# Patient Record
Sex: Male | Born: 1965
Health system: Southern US, Community
[De-identification: ages and names within clinical notes are randomized; demographics above are authoritative.]

## PROBLEM LIST (undated history)

## (undated) DIAGNOSIS — K219 Gastro-esophageal reflux disease without esophagitis: Secondary | ICD-10-CM

## (undated) DIAGNOSIS — E785 Hyperlipidemia, unspecified: Secondary | ICD-10-CM

## (undated) DIAGNOSIS — T7840XA Allergy, unspecified, initial encounter: Secondary | ICD-10-CM

## (undated) HISTORY — DX: Gastro-esophageal reflux disease without esophagitis: K21.9

## (undated) HISTORY — DX: Allergy, unspecified, initial encounter: T78.40XA

## (undated) HISTORY — PX: VASECTOMY: SHX75

## (undated) HISTORY — DX: Hyperlipidemia, unspecified: E78.5

## (undated) HISTORY — PX: NO PAST SURGERIES: SHX2092

---

## 2007-08-22 ENCOUNTER — Ambulatory Visit: Payer: Self-pay | Admitting: Family Medicine

## 2007-08-30 ENCOUNTER — Ambulatory Visit: Payer: Self-pay | Admitting: Family Medicine

## 2007-10-14 ENCOUNTER — Emergency Department: Payer: Self-pay | Admitting: Emergency Medicine

## 2008-12-03 IMAGING — CT CT CHEST-ABD W/ CM
3 of 6 series · 13 of 32 positions shown, 18 images · non-contrast
Comparison: none

REASON FOR EXAM: RUQ  abd pain  right lower chest pain   Per Dr. Brabir
wants w/wo
COMMENTS:

[Series 2: soft tissue · axial · 0.73mm/px · z∈[-466,-121]mm · 6 of 97 slices shown, 11 images (1 of 3)]
[im 14/97  soft-tissue]
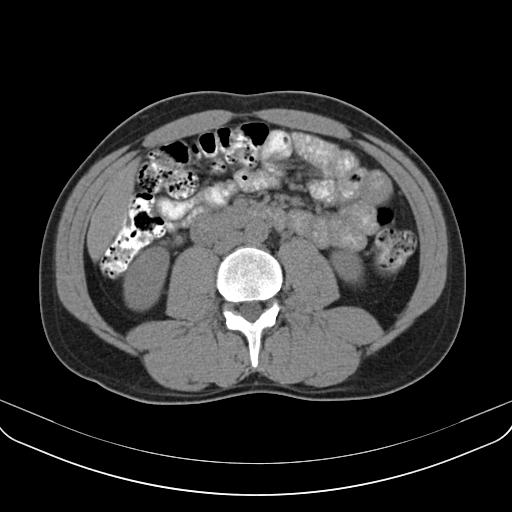
[im 14/97  bone]
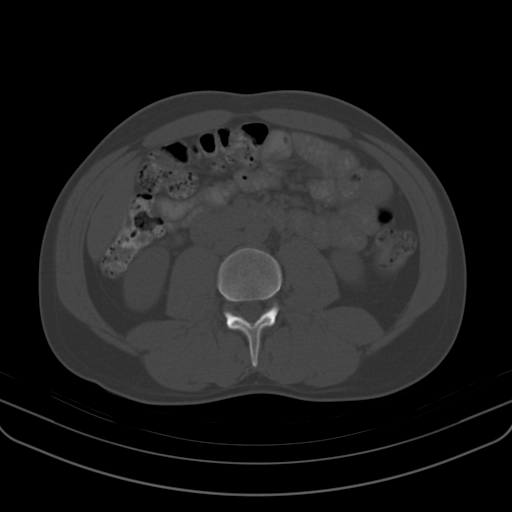
[im 28/97  soft-tissue]
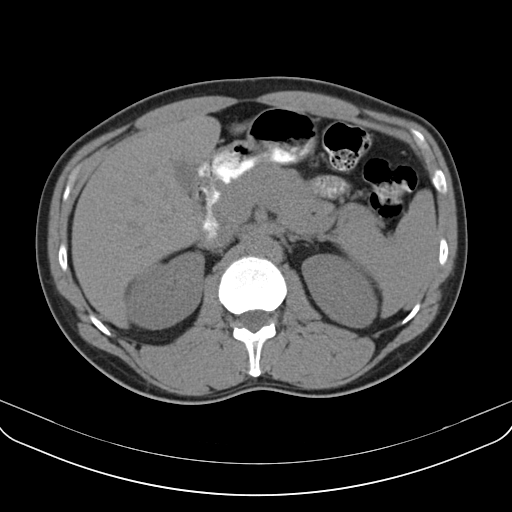
[im 42/97  soft-tissue]
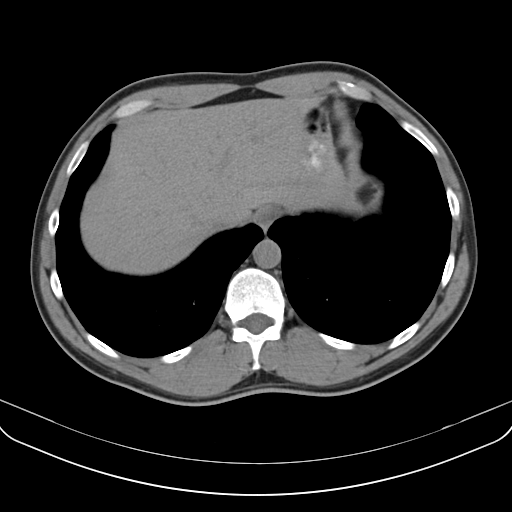
[im 42/97  lung]
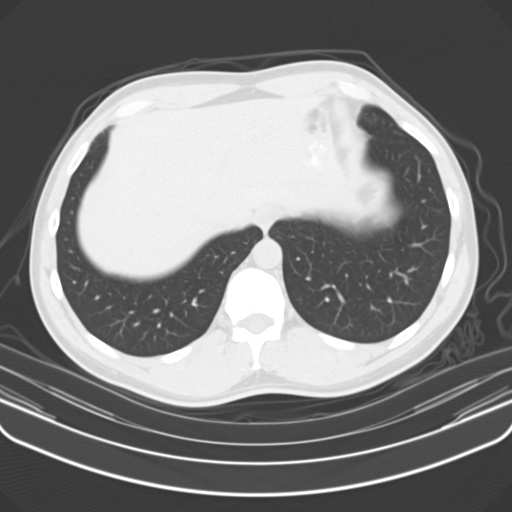
[im 55/97  soft-tissue]
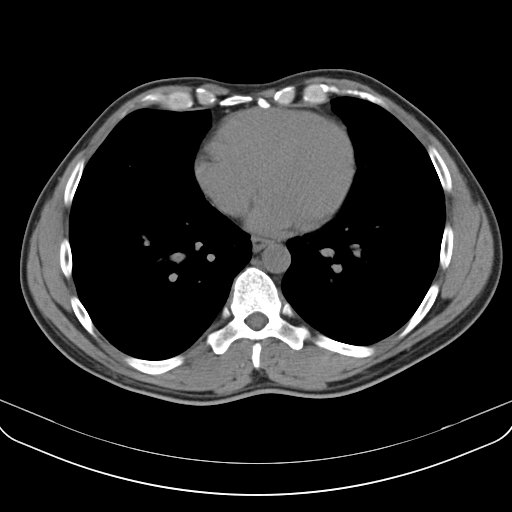
[im 55/97  lung]
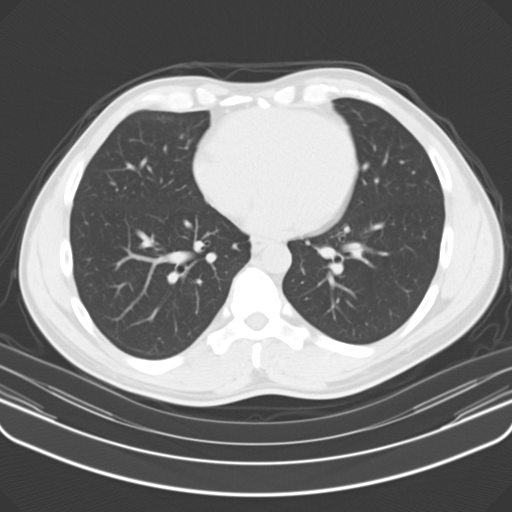
[im 69/97  soft-tissue]
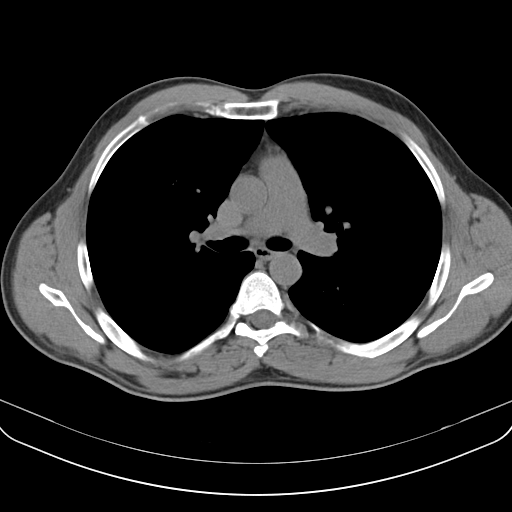
[im 69/97  lung]
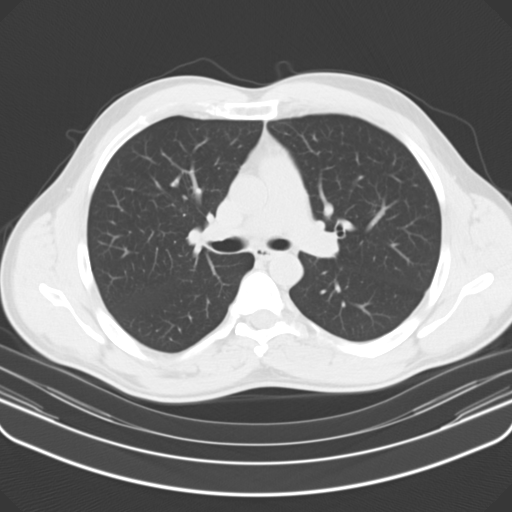
[im 83/97  soft-tissue]
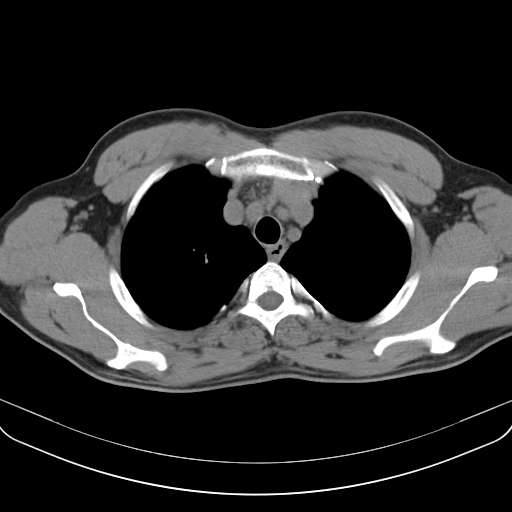
[im 83/97  lung]
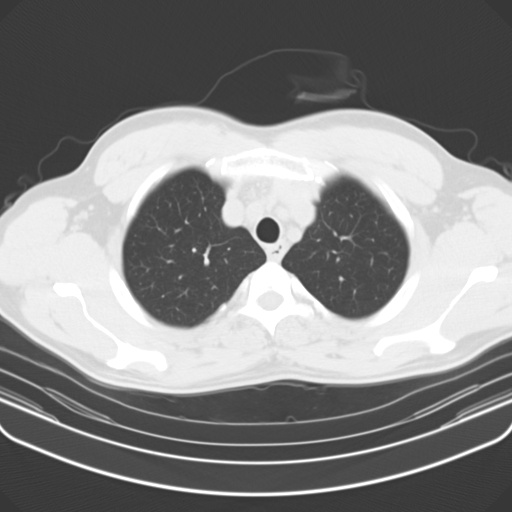

[Series 4: soft tissue · axial · 0.73mm/px · z∈[-451,-131]mm · 5 of 97 slices shown (2 of 3)]
[im 17/97  soft-tissue]
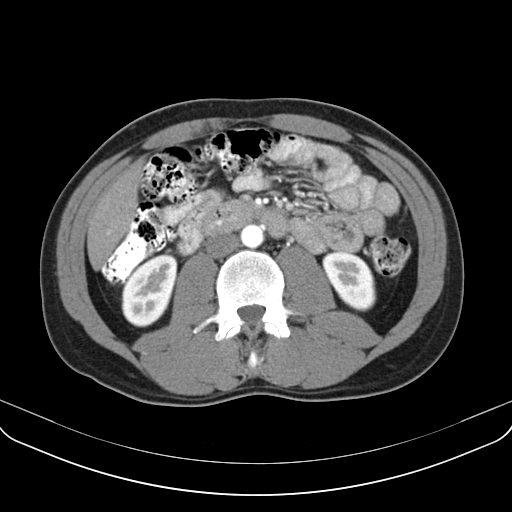
[im 33/97  soft-tissue]
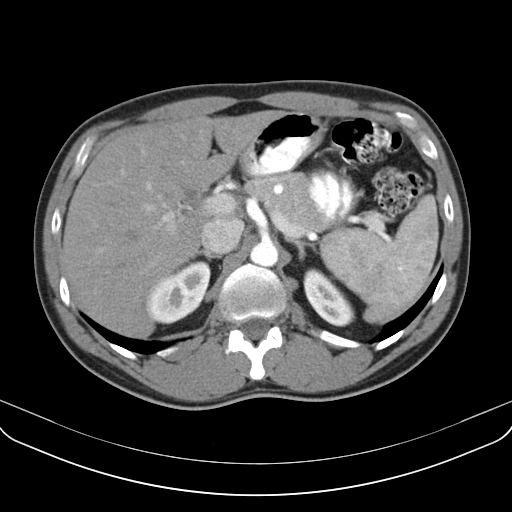
[im 49/97  soft-tissue]
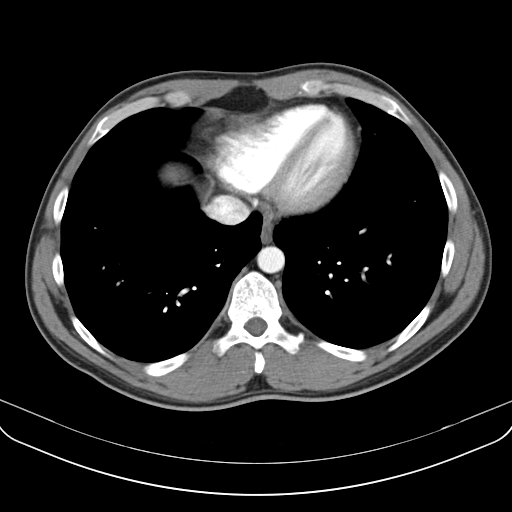
[im 65/97  soft-tissue]
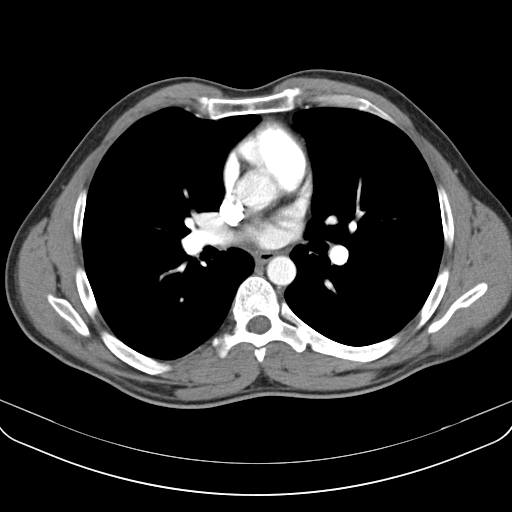
[im 81/97  soft-tissue]
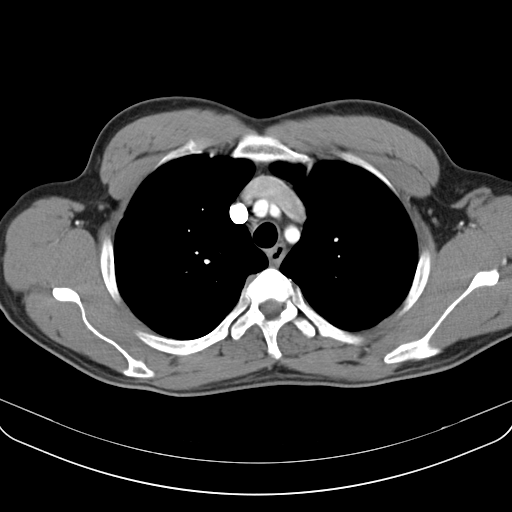

[Series 8: soft tissue · axial · 0.73mm/px · z∈[-446,-362]mm · 2 of 52 slices shown (3 of 3)]
[im 18/52  soft-tissue]
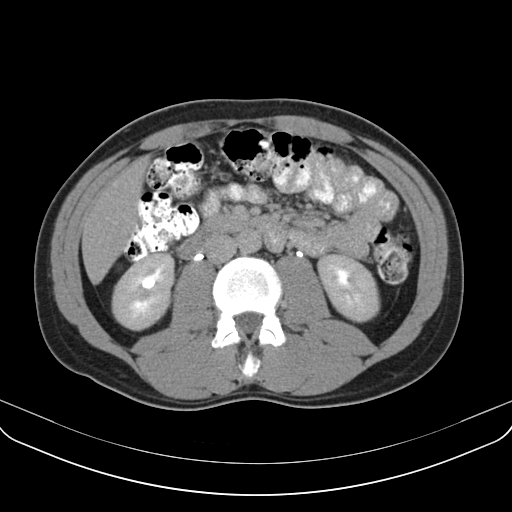
[im 35/52  soft-tissue]
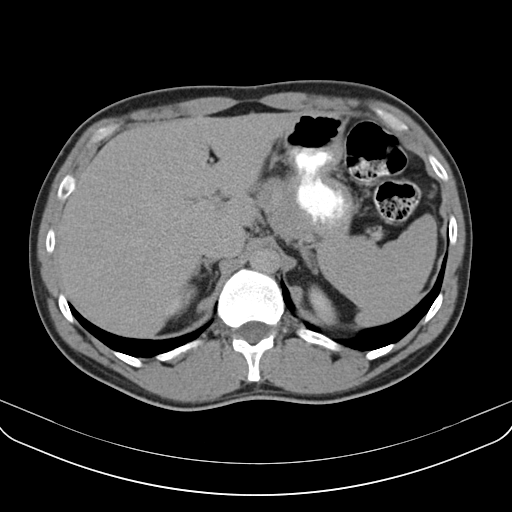

[13 of 32 positions shown; findings below may reference images not displayed]

PROCEDURE:     [facility] - K[facility]IC CHEST AND ABDOMEN W  - August 30, 2007 [DATE]

RESULT:     Triphasic CT scan of the abdomen without and with contrast and
CT of the chest is performed. The patient received 150 ml of 9sovue-XGG
iodinated intravenous contrast for the exam. There is no previous exam for
comparison.

Noncontrast images of the chest and abdomen demonstrate no evidence of
abnormal calcification within the mediastinal or hilar regions. The
abdominal aorta and thoracic aorta show no evidence of significant
atherosclerotic calcification. Very minimal atherosclerotic calcification is
present. There are no radiopaque gallstones or renal calculi.

Following administration of iodinated contrast, the imaging through the
chest shows normal enhancement of the pulmonary arteries and aorta. The
lungs are clear. There is no infiltrate, edema, pleural effusion or
underlying pulmonary parenchymal mass. There is no atelectasis or
significant fibrosis. The heart is normal in size. There is no hiatal
hernia. The spleen, pancreas, liver, gallbladder, kidneys and adrenal glands
as well as the abdominal aorta appear to be normal. The included bowel is
within normal limits.

On the delayed postcontrast images both kidneys excrete contrast opacified
urine into nonobstructed systems. There is no adenopathy evident.
IMPRESSION: 1. No focal cardiopulmonary abnormality evident.
2. No abdominal abnormality evident.

## 2011-04-27 ENCOUNTER — Ambulatory Visit: Payer: Self-pay | Admitting: Neurology

## 2011-11-05 ENCOUNTER — Emergency Department: Payer: Self-pay | Admitting: Emergency Medicine

## 2011-11-05 LAB — COMPREHENSIVE METABOLIC PANEL
Alkaline Phosphatase: 64 U/L (ref 50–136)
Anion Gap: 10 (ref 7–16)
Calcium, Total: 8.8 mg/dL (ref 8.5–10.1)
Chloride: 107 mmol/L (ref 98–107)
Co2: 23 mmol/L (ref 21–32)
Creatinine: 0.88 mg/dL (ref 0.60–1.30)
Glucose: 124 mg/dL — ABNORMAL HIGH (ref 65–99)
Osmolality: 281 (ref 275–301)
Sodium: 140 mmol/L (ref 136–145)
Total Protein: 7.1 g/dL (ref 6.4–8.2)

## 2011-11-05 LAB — CBC
HCT: 45.6 % (ref 40.0–52.0)
HGB: 15.4 g/dL (ref 13.0–18.0)
MCH: 31.8 pg (ref 26.0–34.0)
MCHC: 33.9 g/dL (ref 32.0–36.0)
MCV: 94 fL (ref 80–100)
Platelet: 176 10*3/uL (ref 150–440)
RDW: 12.7 % (ref 11.5–14.5)

## 2012-09-29 ENCOUNTER — Ambulatory Visit: Payer: Self-pay | Admitting: Family Medicine

## 2012-12-29 ENCOUNTER — Ambulatory Visit: Payer: Self-pay

## 2014-01-17 LAB — CBC AND DIFFERENTIAL
HCT: 46 % (ref 41–53)
HEMOGLOBIN: 16.1 g/dL (ref 13.5–17.5)
NEUTROS ABS: 50 /uL
PLATELETS: 183 10*3/uL (ref 150–399)
WBC: 4.6 10*3/mL

## 2014-01-17 LAB — LIPID PANEL
CHOLESTEROL: 213 mg/dL — AB (ref 0–200)
HDL: 51 mg/dL (ref 35–70)
LDL CALC: 145 mg/dL
LDl/HDL Ratio: 2.8
TRIGLYCERIDES: 86 mg/dL (ref 40–160)

## 2014-01-17 LAB — HEPATIC FUNCTION PANEL
ALK PHOS: 62 U/L (ref 25–125)
ALT: 21 U/L (ref 10–40)
AST: 21 U/L (ref 14–40)
BILIRUBIN, TOTAL: 0.9 mg/dL

## 2014-01-17 LAB — TSH: TSH: 3.28 u[IU]/mL (ref <=5.90)

## 2014-01-17 LAB — PSA: PSA: 0.8

## 2014-05-09 LAB — HM COLONOSCOPY

## 2015-01-11 DIAGNOSIS — K219 Gastro-esophageal reflux disease without esophagitis: Secondary | ICD-10-CM | POA: Insufficient documentation

## 2015-01-11 DIAGNOSIS — G8929 Other chronic pain: Secondary | ICD-10-CM | POA: Insufficient documentation

## 2015-01-11 DIAGNOSIS — G609 Hereditary and idiopathic neuropathy, unspecified: Secondary | ICD-10-CM | POA: Insufficient documentation

## 2015-01-11 DIAGNOSIS — M545 Low back pain, unspecified: Secondary | ICD-10-CM | POA: Insufficient documentation

## 2015-01-11 DIAGNOSIS — L309 Dermatitis, unspecified: Secondary | ICD-10-CM | POA: Insufficient documentation

## 2015-01-11 DIAGNOSIS — G2581 Restless legs syndrome: Secondary | ICD-10-CM | POA: Insufficient documentation

## 2015-01-11 DIAGNOSIS — B029 Zoster without complications: Secondary | ICD-10-CM | POA: Insufficient documentation

## 2015-01-11 DIAGNOSIS — J309 Allergic rhinitis, unspecified: Secondary | ICD-10-CM | POA: Insufficient documentation

## 2015-01-11 DIAGNOSIS — E785 Hyperlipidemia, unspecified: Secondary | ICD-10-CM | POA: Insufficient documentation

## 2015-01-11 DIAGNOSIS — E559 Vitamin D deficiency, unspecified: Secondary | ICD-10-CM | POA: Insufficient documentation

## 2015-02-12 ENCOUNTER — Other Ambulatory Visit: Payer: Self-pay | Admitting: Family Medicine

## 2015-02-12 MED ORDER — SIMVASTATIN 40 MG PO TABS: 40.0000 mg | ORAL_TABLET | Freq: Every day | ORAL | Status: DC

## 2015-02-12 NOTE — Telephone Encounter (Signed)
Done-aa 

## 2015-02-12 NOTE — Telephone Encounter (Signed)
Pt's wife contacted office for refill request on the following medications: simvastatin (ZOCOR) 40 MG tablet to [facility]. Thanks TNP

## 2015-03-18 ENCOUNTER — Encounter: Payer: Self-pay | Admitting: Family Medicine

## 2015-03-18 ENCOUNTER — Ambulatory Visit (INDEPENDENT_AMBULATORY_CARE_PROVIDER_SITE_OTHER): Payer: BLUE CROSS/BLUE SHIELD | Admitting: Family Medicine

## 2015-03-18 VITALS — BP 128/72 | HR 72 | Temp 98.0°F | Resp 14 | Ht 70.0 in | Wt 184.0 lb

## 2015-03-18 DIAGNOSIS — Z125 Encounter for screening for malignant neoplasm of prostate: Secondary | ICD-10-CM | POA: Diagnosis not present

## 2015-03-18 DIAGNOSIS — M791 Myalgia, unspecified site: Secondary | ICD-10-CM

## 2015-03-18 DIAGNOSIS — R253 Fasciculation: Secondary | ICD-10-CM

## 2015-03-18 DIAGNOSIS — Z0000 Encounter for general adult medical examination without abnormal findings: Secondary | ICD-10-CM

## 2015-03-18 DIAGNOSIS — R258 Other abnormal involuntary movements: Secondary | ICD-10-CM

## 2015-03-18 NOTE — Progress Notes (Signed)
Patient ID: Alan Schultz, male   DOB: 21-Apr-1966, 49 y.o.   MRN: [id number]  Visit Date: 03/18/2015  Today's Provider: [name], MD   Chief Complaint  Patient presents with  . Annual Exam   Subjective:  Alan Schultz is a 49 y.o. male who presents today for health maintenance and complete physical. He feels well. He reports exercising does a lot of yard work. He reports he is sleeping well.  LAST: Colonoscopy-05/09/14 hemorrhoids, otherwise normal  Endoscopy 05/09/14 normal  EKG-01/15/14.   Review of Systems  Constitutional: Negative.   HENT: Negative.   Eyes: Negative.   Respiratory: Negative.   Cardiovascular: Negative.   Gastrointestinal: Negative.   Endocrine: Negative.   Genitourinary: Negative.   Musculoskeletal: Negative.   Skin: Negative.   Allergic/Immunologic: Negative.   Neurological: Negative.        Occasional mild muscle twitching of arms and legs, mainly arms.  Hematological: Negative.   Psychiatric/Behavioral: Negative.     Social History   Social History  . Marital Status: Married    Spouse Name: N/A  . Number of Children: N/A  . Years of Education: N/A   Occupational History  . Not on file.   Social History Main Topics  . Smoking status: Never Smoker   . Smokeless tobacco: Never Used  . Alcohol Use: No  . Drug Use: No  . Sexual Activity: Not on file   Other Topics Concern  . Not on file   Social History Narrative    Patient Active Problem List   Diagnosis Date Noted  . Allergic rhinitis 01/11/2015  . Chronic LBP 01/11/2015  . Dermatitis, eczematoid 01/11/2015  . Acid reflux 01/11/2015  . HLD (hyperlipidemia) 01/11/2015  . Idiopathic peripheral neuropathy 01/11/2015  . Restless leg 01/11/2015  . Herpes zoster 01/11/2015  . Avitaminosis D 01/11/2015    Past Surgical History  Procedure Laterality Date  . Vasectomy      His family history includes Osteoporosis in his mother.    Outpatient Prescriptions Prior to Visit   Medication Sig Dispense Refill  . aspirin 81 MG tablet Take by mouth daily.    . montelukast (SINGULAIR) 10 MG tablet Take by mouth daily.    . Omeprazole 20 MG TBEC Take by mouth daily.    . simvastatin (ZOCOR) 40 MG tablet Take 1 tablet (40 mg total) by mouth at bedtime. 30 tablet 0  . loratadine-pseudoephedrine (CLARITIN-D 24-HOUR) 10-240 MG per 24 hr tablet Take by mouth as needed.     No facility-administered medications prior to visit.    Patient Care Team: Maple Hudson., MD as PCP - General (Family Medicine)     Objective:   Vitals:  Filed Vitals:   03/18/15 1030  BP: 128/72  Pulse: 72  Temp: 98 F (36.7 C)  Resp: 14  Height: 5\' 10"  (1.778 m)  Weight: 184 lb (83.462 kg)    Physical Exam  Constitutional: He is oriented to person, place, and time. He appears well-developed and well-nourished.  HENT:  Head: Normocephalic and atraumatic.  Right Ear: External ear normal.  Nose: Nose normal.  Mouth/Throat: Oropharynx is clear and moist.  Eyes: Conjunctivae are normal. Pupils are equal, round, and reactive to light.  Neck: Neck supple.  Cardiovascular: Normal rate, regular rhythm, normal heart sounds and intact distal pulses.   Pulmonary/Chest: Effort normal and breath sounds normal.  Abdominal: Bowel sounds are normal.  Genitourinary: Rectum normal, prostate normal and penis normal.  Musculoskeletal: Normal  range of motion.  Neurological: He is alert and oriented to person, place, and time.  Skin: Skin is warm.  Very mild eczema of elbows and knees.  Psychiatric: He has a normal mood and affect. His behavior is normal. Judgment normal.     Depression Screen PHQ 2/9 Scores 03/18/2015  PHQ - 2 Score 0      Assessment & Plan:    Complete physical exam Normal exam.  Immunization History  Administered Date(s) Administered  . Td 05/20/1999  . Tdap 01/05/2006   Eczema Treat with mometasone cream. Muscle fasciculations Exam normal today. Refer to  neurology for consideration of workup.  I have done the exam and reviewed the above chart and it is accurate to the best of my knowledge.

## 2015-03-19 LAB — COMPREHENSIVE METABOLIC PANEL
ALT: 16 IU/L (ref 0–44)
AST: 17 IU/L (ref 0–40)
Albumin/Globulin Ratio: 2.1 (ref 1.1–2.5)
Albumin: 4.7 g/dL (ref 3.5–5.5)
Alkaline Phosphatase: 65 IU/L (ref 39–117)
BILIRUBIN TOTAL: 1.3 mg/dL — AB (ref 0.0–1.2)
BUN/Creatinine Ratio: 12 (ref 9–20)
BUN: 12 mg/dL (ref 6–24)
CHLORIDE: 101 mmol/L (ref 97–108)
CO2: 25 mmol/L (ref 18–29)
CREATININE: 0.97 mg/dL (ref 0.76–1.27)
Calcium: 9.6 mg/dL (ref 8.7–10.2)
GFR calc non Af Amer: 90 mL/min/{1.73_m2} (ref >59)
GFR, EST AFRICAN AMERICAN: 104 mL/min/{1.73_m2} (ref >59)
GLUCOSE: 97 mg/dL (ref 65–99)
Globulin, Total: 2.2 g/dL (ref 1.5–4.5)
Potassium: 4.6 mmol/L (ref 3.5–5.2)
Sodium: 141 mmol/L (ref 134–144)
TOTAL PROTEIN: 6.9 g/dL (ref 6.0–8.5)

## 2015-03-19 LAB — CBC WITH DIFFERENTIAL/PLATELET
Basophils Absolute: 0 10*3/uL (ref 0.0–0.2)
Basos: 1 %
EOS (ABSOLUTE): 0.2 10*3/uL (ref 0.0–0.4)
Eos: 6 %
HEMOGLOBIN: 16.3 g/dL (ref 12.6–17.7)
Hematocrit: 45.8 % (ref 37.5–51.0)
IMMATURE GRANS (ABS): 0 10*3/uL (ref 0.0–0.1)
Immature Granulocytes: 0 %
LYMPHS: 38 %
Lymphocytes Absolute: 1.5 10*3/uL (ref 0.7–3.1)
MCH: 32.5 pg (ref 26.6–33.0)
MCHC: 35.6 g/dL (ref 31.5–35.7)
MCV: 91 fL (ref 79–97)
Monocytes Absolute: 0.3 10*3/uL (ref 0.1–0.9)
Monocytes: 8 %
NEUTROS ABS: 2 10*3/uL (ref 1.4–7.0)
Neutrophils: 47 %
PLATELETS: 187 10*3/uL (ref 150–379)
RBC: 5.01 x10E6/uL (ref 4.14–5.80)
RDW: 13.4 % (ref 12.3–15.4)
WBC: 4.1 10*3/uL (ref 3.4–10.8)

## 2015-03-19 LAB — CK: CK TOTAL: 107 U/L (ref 24–204)

## 2015-03-19 LAB — POCT URINALYSIS DIPSTICK
BILIRUBIN UA: NEGATIVE
Glucose, UA: NEGATIVE
Ketones, UA: NEGATIVE
Leukocytes, UA: NEGATIVE
NITRITE UA: NEGATIVE
Protein, UA: NEGATIVE
RBC UA: NEGATIVE
SPEC GRAV UA: 1.025
Urobilinogen, UA: 0.2
pH, UA: 6.5

## 2015-03-19 LAB — LIPID PANEL WITH LDL/HDL RATIO
Cholesterol, Total: 216 mg/dL — ABNORMAL HIGH (ref 100–199)
HDL: 48 mg/dL (ref >39)
LDL Calculated: 139 mg/dL — ABNORMAL HIGH (ref 0–99)
LDL/HDL RATIO: 2.9 ratio (ref 0.0–3.6)
Triglycerides: 145 mg/dL (ref 0–149)
VLDL Cholesterol Cal: 29 mg/dL (ref 5–40)

## 2015-03-19 LAB — PSA: Prostate Specific Ag, Serum: 0.9 ng/mL (ref 0.0–4.0)

## 2015-03-19 LAB — TSH: TSH: 2.13 u[IU]/mL (ref 0.450–4.500)

## 2015-03-31 ENCOUNTER — Other Ambulatory Visit: Payer: Self-pay | Admitting: Family Medicine

## 2015-03-31 MED ORDER — PROMETHAZINE HCL 25 MG PO TABS: 25.0000 mg | ORAL_TABLET | Freq: Four times a day (QID) | ORAL | Status: DC | PRN

## 2015-03-31 NOTE — Telephone Encounter (Signed)
Promethazine 25 mg every 6 hours when necessary nausea #25 one refill

## 2015-03-31 NOTE — Telephone Encounter (Signed)
Ok to Rx Promethazine? Please advise. Thanks!

## 2015-03-31 NOTE — Telephone Encounter (Signed)
Pt wife Toniann Fail called to request a Rx for nausea for pt.  Pt started having nausea  last night.  No diarrhea and no throwing up.  Pt wife states she has had Trometrarazine before.  [employer] [facility].  CB#[phone]/MW

## 2015-03-31 NOTE — Telephone Encounter (Signed)
Rx sent to pharmacy   

## 2015-04-04 ENCOUNTER — Ambulatory Visit (INDEPENDENT_AMBULATORY_CARE_PROVIDER_SITE_OTHER): Payer: BLUE CROSS/BLUE SHIELD | Admitting: Family Medicine

## 2015-04-04 ENCOUNTER — Encounter: Payer: Self-pay | Admitting: Family Medicine

## 2015-04-04 VITALS — BP 118/68 | HR 70 | Temp 97.7°F | Resp 16 | Wt 187.8 lb

## 2015-04-04 DIAGNOSIS — A09 Infectious gastroenteritis and colitis, unspecified: Secondary | ICD-10-CM

## 2015-04-04 DIAGNOSIS — K529 Noninfective gastroenteritis and colitis, unspecified: Secondary | ICD-10-CM

## 2015-04-04 NOTE — Patient Instructions (Signed)
Discussed use of imodium and keeping up with fluids.

## 2015-04-04 NOTE — Progress Notes (Signed)
Subjective:     Patient ID: Alan Schultz, male   DOB: 22-Aug-1965, 49 y.o.   MRN: [id number]  HPI  Chief Complaint  Patient presents with  . Diarrhea    Patient comes in office today with complaints of nausea, vomiting and diarrhea since Sunday. Patient reports that Saturday evening he had attended a wedding and at reception table was served chicken, it wasnt till latley when it was discovered that chicked was not cooked all the way. Patient states that nausea and vomiting have stopped but diarrhea has remained persistant.  Reports chills at onset but no fever documented. Diarrhea remains loose and watery once daily. Has resumed eating and is drinking Gatorade to keep up with his fluids. He is checking to see if others are ill. He has missed work 9/12-9/14.   Review of Systems  Gastrointestinal:       Usual bowel pattern is every other day.       Objective:   Physical Exam  Constitutional: He appears well-developed and well-nourished. No distress.  Abdominal: Soft. There is no tenderness. There is no guarding.  Hyperactive bowel sounds       Assessment:    1. Gastroenteritis presumed infectious    Plan:    May use imodium as needed. Work excuse for 9/12-9/14 provided.

## 2015-07-22 ENCOUNTER — Other Ambulatory Visit: Payer: Self-pay | Admitting: Family Medicine

## 2015-07-22 MED ORDER — SIMVASTATIN 40 MG PO TABS: 40.0000 mg | ORAL_TABLET | Freq: Every day | ORAL | Status: DC

## 2015-07-22 NOTE — Telephone Encounter (Signed)
RX sent in-aa 

## 2015-07-22 NOTE — Telephone Encounter (Signed)
Pt contacted office for refill request on the following medications:  simvastatin (ZOCOR) 40 MG tablet.  [facility]  [facility]  ZO#[phone]/WJCB#[phone]/MW

## 2015-10-31 ENCOUNTER — Ambulatory Visit
Admission: EM | Admit: 2015-10-31 | Discharge: 2015-10-31 | Disposition: A | Discharge status: Home / Self Care | Payer: Managed Care, Other (non HMO) | Attending: Family Medicine | Admitting: Family Medicine

## 2015-10-31 ENCOUNTER — Encounter: Payer: Self-pay | Admitting: Emergency Medicine

## 2015-10-31 DIAGNOSIS — R05 Cough: Secondary | ICD-10-CM

## 2015-10-31 DIAGNOSIS — R059 Cough, unspecified: Secondary | ICD-10-CM

## 2015-10-31 LAB — RAPID INFLUENZA A&B ANTIGENS (ARMC ONLY): INFLUENZA B (ARMC): NEGATIVE

## 2015-10-31 LAB — RAPID INFLUENZA A&B ANTIGENS: Influenza A (ARMC): NEGATIVE

## 2015-10-31 MED ORDER — AZITHROMYCIN 250 MG PO TABS: ORAL_TABLET | ORAL | Status: DC

## 2015-10-31 MED ORDER — ACETAMINOPHEN 500 MG PO TABS
1000.0000 mg | ORAL_TABLET | Freq: Once | ORAL | Status: AC
  Administered 2015-10-31: 1000 mg via ORAL

## 2015-10-31 NOTE — ED Notes (Signed)
Patient c/o sneezing, cough, runny nose, and nasal congestion for a week.

## 2015-10-31 NOTE — ED Provider Notes (Signed)
CSN: [id number]     Arrival date & time 10/31/15  1751 History   First MD Initiated Contact with Patient 10/31/15 1903     Chief Complaint  Patient presents with  . Cough  . Nasal Congestion   (Consider location/radiation/quality/duration/timing/severity/associated sxs/prior Treatment) Patient is a 50 y.o. male presenting with URI. The history is provided by the patient.  URI Presenting symptoms: congestion, cough and rhinorrhea   Severity:  Moderate Onset quality:  Sudden Duration:  1 week Timing:  Constant Progression:  Unchanged Chronicity:  New Relieved by:  Nothing Ineffective treatments:  OTC medications Associated symptoms: no arthralgias, no headaches, no sinus pain and no wheezing   Risk factors: not elderly, no chronic cardiac disease, no chronic kidney disease, no chronic respiratory disease, no diabetes mellitus, no immunosuppression, no recent illness, no recent travel and no sick contacts     Past Medical History  Diagnosis Date  . Allergy   . Hyperlipidemia   . GERD (gastroesophageal reflux disease)    Past Surgical History  Procedure Laterality Date  . Vasectomy    . No past surgeries     Family History  Problem Relation Age of Onset  . Osteoporosis Mother    Social History  Substance Use Topics  . Smoking status: Never Smoker   . Smokeless tobacco: Never Used  . Alcohol Use: No    Review of Systems  HENT: Positive for congestion and rhinorrhea.   Respiratory: Positive for cough. Negative for wheezing.   Musculoskeletal: Negative for arthralgias.  Neurological: Negative for headaches.    Allergies  Review of patient's allergies indicates no known allergies.  Home Medications   Prior to Admission medications   Medication Sig Start Date End Date Taking? Authorizing Provider  aspirin 81 MG tablet Take by mouth daily.    Historical Provider, MD  azithromycin (ZITHROMAX Z-PAK) 250 MG tablet 2 tabs po once day 1, then 1 tab po qd for next 4 days  10/31/15   Payton Mccallumrlando Eliya Geiman, MD  chlorpheniramine-HYDROcodone [facility](TUSSIONEX PENNKINETIC ER) 10-8 MG/5ML SUER Take 5 mLs by mouth every 12 (twelve) hours as needed for cough. Can cause drowsiness. 11/01/15   Jolene ProvostKirtida Patel, MD  montelukast (SINGULAIR) 10 MG tablet Take by mouth daily. 01/15/14   Historical Provider, MD  Omeprazole 20 MG TBEC Take by mouth daily. 01/29/14   Historical Provider, MD  simvastatin (ZOCOR) 40 MG tablet Take 1 tablet (40 mg total) by mouth at bedtime. 07/22/15   Richard Hulen ShoutsL Gilbert Jr., MD   Meds Ordered and Administered this Visit   Medications  acetaminophen (TYLENOL) tablet 1,000 mg (1,000 mg Oral Given 10/31/15 1900)    BP 130/86 mmHg  Pulse 87  Temp(Src) 101.1 F (38.4 C) (Oral)  Resp 16  Ht 5\' 9"  (1.753 m)  Wt 182 lb (82.555 kg)  BMI 26.86 kg/m2  SpO2 100% No data found.   Physical Exam  Constitutional: He appears well-developed and well-nourished. No distress.  HENT:  Head: Normocephalic and atraumatic.  Right Ear: Tympanic membrane, external ear and ear canal normal.  Left Ear: Tympanic membrane, external ear and ear canal normal.  Nose: Nose normal.  Mouth/Throat: Uvula is midline, oropharynx is clear and moist and mucous membranes are normal. No oropharyngeal exudate or tonsillar abscesses.  Eyes: Conjunctivae and EOM are normal. Pupils are equal, round, and reactive to light. Right eye exhibits no discharge. Left eye exhibits no discharge. No scleral icterus.  Neck: Normal range of motion. Neck supple. No tracheal deviation present.  No thyromegaly present.  Cardiovascular: Normal rate, regular rhythm and normal heart sounds.   Pulmonary/Chest: Effort normal. No stridor. No respiratory distress. He has no wheezes. He has rales (left base). He exhibits no tenderness.  Lymphadenopathy:    He has no cervical adenopathy.  Neurological: He is alert.  Skin: Skin is warm and dry. No rash noted. He is not diaphoretic.  Nursing note and vitals reviewed.   ED  Course  Procedures (including critical care time)  Labs Review Labs Reviewed  RAPID INFLUENZA A&B ANTIGENS [facility] ONLY)    Imaging Review No results found.   Visual Acuity Review  Right Eye Distance:   Left Eye Distance:   Bilateral Distance:    Right Eye Near:   Left Eye Near:    Bilateral Near:         MDM   1. Cough    Discharge Medication List as of 10/31/2015  7:45 PM    START taking these medications   Details  azithromycin (ZITHROMAX Z-PAK) 250 MG tablet 2 tabs po once day 1, then 1 tab po qd for next 4 days, Normal       1. Lab results and diagnosis reviewed with patient 2. rx as per orders above; reviewed possible side effects, interactions, risks and benefits  3. Recommend supportive treatment with rest, increased fluids, otc analgesics 4. Follow-up prn if symptoms worsen or don't improve  Payton Mccallum, MD 11/14/15 1213

## 2015-11-01 MED ORDER — HYDROCOD POLST-CPM POLST ER 10-8 MG/5ML PO SUER: 5.0000 mL | Freq: Two times a day (BID) | ORAL | Status: DC | PRN

## 2016-03-18 ENCOUNTER — Encounter: Payer: BLUE CROSS/BLUE SHIELD | Admitting: Family Medicine

## 2016-04-06 ENCOUNTER — Ambulatory Visit (INDEPENDENT_AMBULATORY_CARE_PROVIDER_SITE_OTHER): Payer: Managed Care, Other (non HMO) | Admitting: Family Medicine

## 2016-04-06 VITALS — BP 110/70 | HR 72 | Temp 98.2°F | Resp 16 | Ht 69.0 in | Wt 185.0 lb

## 2016-04-06 DIAGNOSIS — Z1211 Encounter for screening for malignant neoplasm of colon: Secondary | ICD-10-CM | POA: Diagnosis not present

## 2016-04-06 DIAGNOSIS — Z0000 Encounter for general adult medical examination without abnormal findings: Secondary | ICD-10-CM

## 2016-04-06 DIAGNOSIS — G4700 Insomnia, unspecified: Secondary | ICD-10-CM | POA: Diagnosis not present

## 2016-04-06 DIAGNOSIS — Z125 Encounter for screening for malignant neoplasm of prostate: Secondary | ICD-10-CM | POA: Diagnosis not present

## 2016-04-06 DIAGNOSIS — F5104 Psychophysiologic insomnia: Secondary | ICD-10-CM

## 2016-04-06 LAB — POCT URINALYSIS DIPSTICK
Bilirubin, UA: NEGATIVE
Blood, UA: NEGATIVE
Glucose, UA: NEGATIVE
Ketones, UA: NEGATIVE
LEUKOCYTES UA: NEGATIVE
Nitrite, UA: NEGATIVE
PH UA: 6.5
PROTEIN UA: NEGATIVE
SPEC GRAV UA: 1.02
UROBILINOGEN UA: NEGATIVE

## 2016-04-06 LAB — IFOBT (OCCULT BLOOD): IMMUNOLOGICAL FECAL OCCULT BLOOD TEST: NEGATIVE

## 2016-04-06 MED ORDER — DIAZEPAM 5 MG PO TABS: 5.0000 mg | ORAL_TABLET | Freq: Every evening | ORAL | 5 refills | Status: DC | PRN

## 2016-04-06 NOTE — Progress Notes (Signed)
Patient: Alan Schultz, Male    DOB: May 22, 1966, 50 y.o.   MRN: [id number] Visit Date: 04/06/2016  Today's Provider: Megan Mansichard Jerad Dunlap Jr, MD   Chief Complaint  Patient presents with  . Annual Exam   Subjective:  Alan Schultz is a 50 y.o. male who presents today for health maintenance and complete physical. He feels well. He reports exercising none. He reports he is sleeping poorly.  Immunization History  Administered Date(s) Administered  . Td 05/20/1999  . Tdap 01/05/2006   05/09/14 Colonoscopy-normal, repeat 10 years.   Review of Systems  Constitutional: Negative.   HENT: Negative.   Eyes: Negative.   Respiratory: Negative.        Patient states that a couple times daily he has to take a deep breath to overcome the sensation of feeling like he has to yawn  Cardiovascular: Negative.   Gastrointestinal: Negative.   Endocrine: Negative.        Patient complains of his feet feeling cold.  Neurologist put him on Vit B12 in the spring.  Genitourinary: Negative.   Musculoskeletal: Negative.   Skin: Positive for rash.       Left second toe with rash, left upper lip with rash  Allergic/Immunologic: Negative.   Neurological: Negative.   Hematological: Negative.   Psychiatric/Behavioral: Positive for sleep disturbance (insomnia-difficulty falling and staying asleep).    Social History   Social History  . Marital status: Married    Spouse name: N/A  . Number of children: N/A  . Years of education: N/A   Occupational History  . Not on file.   Social History Main Topics  . Smoking status: Never Smoker  . Smokeless tobacco: Never Used  . Alcohol use No  . Drug use: No  . Sexual activity: Not on file   Other Topics Concern  . Not on file   Social History Narrative  . No narrative on file    Patient Active Problem List   Diagnosis Date Noted  . Allergic rhinitis 01/11/2015  . Chronic LBP 01/11/2015  . Dermatitis, eczematoid 01/11/2015  . Acid reflux 01/11/2015  .  HLD (hyperlipidemia) 01/11/2015  . Idiopathic peripheral neuropathy (HCC) 01/11/2015  . Restless leg 01/11/2015  . Herpes zoster 01/11/2015  . Avitaminosis D 01/11/2015    Past Surgical History:  Procedure Laterality Date  . NO PAST SURGERIES    . VASECTOMY      His family history includes Osteoporosis in his mother.    Outpatient Encounter Prescriptions as of 04/06/2016  Medication Sig Note  . aspirin 81 MG tablet Take by mouth daily. 01/11/2015: Received from: [facility] Received Sig: Take by mouth.  . cholecalciferol (VITAMIN D) 1000 units tablet Take 1,000 Units by mouth daily.   . simvastatin (ZOCOR) 40 MG tablet Take 1 tablet (40 mg total) by mouth at bedtime.   . vitamin B-12 (CYANOCOBALAMIN) 1000 MCG tablet Take 1,000 mcg by mouth daily.   . [DISCONTINUED] azithromycin (ZITHROMAX Z-PAK) 250 MG tablet 2 tabs po once day 1, then 1 tab po qd for next 4 days   . [DISCONTINUED] chlorpheniramine-HYDROcodone (TUSSIONEX PENNKINETIC ER) 10-8 MG/5ML SUER Take 5 mLs by mouth every 12 (twelve) hours as needed for cough. Can cause drowsiness.   . [DISCONTINUED] montelukast (SINGULAIR) 10 MG tablet Take by mouth daily. 01/11/2015: Received from: [facility] Received Sig: Take by mouth.  . [DISCONTINUED] Omeprazole 20 MG TBEC Take by mouth daily. 01/11/2015: Received from: [facility] Received Sig: Take  by mouth.   No facility-administered encounter medications on file as of 04/06/2016.     Patient Care Team: Maple Hudson., MD as PCP - General (Family Medicine)     Objective:   Vitals:  Vitals:   04/06/16 0942  BP: 110/70  Pulse: 72  Resp: 16  Temp: 98.2 F (36.8 C)  TempSrc: Oral  Weight: 185 lb (83.9 kg)  Height: 5\' 9"  (1.753 m)    Physical Exam  Constitutional: He is oriented to person, place, and time. He appears well-developed and well-nourished.  HENT:  Head: Normocephalic and atraumatic.  Right Ear: External  ear normal.  Left Ear: External ear normal.  Nose: Nose normal.  Mouth/Throat: Oropharynx is clear and moist.  Eyes: Conjunctivae and EOM are normal. Pupils are equal, round, and reactive to light.  Neck: Normal range of motion. Neck supple.  Cardiovascular: Normal rate, regular rhythm, normal heart sounds and intact distal pulses.   Pulmonary/Chest: Effort normal and breath sounds normal.  Abdominal: Soft. Bowel sounds are normal.  Genitourinary: Rectum normal, prostate normal and penis normal.  Musculoskeletal: Normal range of motion.  Neurological: He is alert and oriented to person, place, and time.  Skin: Skin is warm and dry. Rash noted.  Mild chronic small dermatitis of left upper lip.  Psychiatric: He has a normal mood and affect. His behavior is normal. Judgment and thought content normal.   Current Exercise Habits: The patient does not participate in regular exercise at present;The patient has a physically strenous job, but has no regular exercise apart from work.   Depression Screen PHQ 2/9 Scores 03/18/2015  PHQ - 2 Score 0      Assessment & Plan:     Routine Health Maintenance and Physical Exam  Exercise Activities and Dietary recommendations Goals    None      Immunization History  Administered Date(s) Administered  . Td 05/20/1999  . Tdap 01/05/2006    Health Maintenance  Topic Date Due  . Hepatitis C Screening  [birth year]  . HIV Screening  03/11/1979  . TETANUS/TDAP  01/06/2016  . INFLUENZA VACCINE  02/17/2016  . COLONOSCOPY  05/09/2024      Discussed health benefits of physical activity, and encouraged him to engage in regular exercise appropriate for his age and condition.   Chronic insomnia Patient recognizes increased job stress over the last year.  Chronic mild restless legs syndrome For these issues try diazepam 5 mg daily at bedtime. Return to clinic 1-2 months. Dermatitis of left upper lip Dyshidrotic eczema of left second toe I have  done the exam and reviewed the chart and it is accurate to the best of my knowledge. Julieanne Manson M.D. [facility]

## 2016-04-07 ENCOUNTER — Telehealth: Payer: Self-pay

## 2016-04-07 LAB — LIPID PANEL WITH LDL/HDL RATIO
CHOLESTEROL TOTAL: 186 mg/dL (ref 100–199)
HDL: 53 mg/dL (ref >39)
LDL CALC: 113 mg/dL — AB (ref 0–99)
LDl/HDL Ratio: 2.1 ratio units (ref 0.0–3.6)
TRIGLYCERIDES: 98 mg/dL (ref 0–149)
VLDL CHOLESTEROL CAL: 20 mg/dL (ref 5–40)

## 2016-04-07 LAB — COMPREHENSIVE METABOLIC PANEL
ALK PHOS: 65 IU/L (ref 39–117)
ALT: 16 IU/L (ref 0–44)
AST: 13 IU/L (ref 0–40)
Albumin/Globulin Ratio: 1.9 (ref 1.2–2.2)
Albumin: 4.7 g/dL (ref 3.5–5.5)
BUN/Creatinine Ratio: 13 (ref 9–20)
BUN: 13 mg/dL (ref 6–24)
Bilirubin Total: 1.5 mg/dL — ABNORMAL HIGH (ref 0.0–1.2)
CO2: 25 mmol/L (ref 18–29)
CREATININE: 0.99 mg/dL (ref 0.76–1.27)
Calcium: 9.7 mg/dL (ref 8.7–10.2)
Chloride: 104 mmol/L (ref 96–106)
GFR calc Af Amer: 101 mL/min/{1.73_m2} (ref >59)
GFR calc non Af Amer: 87 mL/min/{1.73_m2} (ref >59)
GLUCOSE: 99 mg/dL (ref 65–99)
Globulin, Total: 2.5 g/dL (ref 1.5–4.5)
Potassium: 4.8 mmol/L (ref 3.5–5.2)
Sodium: 145 mmol/L — ABNORMAL HIGH (ref 134–144)
Total Protein: 7.2 g/dL (ref 6.0–8.5)

## 2016-04-07 LAB — CBC WITH DIFFERENTIAL/PLATELET
BASOS ABS: 0.1 10*3/uL (ref 0.0–0.2)
Basos: 1 %
EOS (ABSOLUTE): 0.1 10*3/uL (ref 0.0–0.4)
Eos: 3 %
Hematocrit: 46.6 % (ref 37.5–51.0)
Hemoglobin: 16.7 g/dL (ref 12.6–17.7)
Immature Grans (Abs): 0 10*3/uL (ref 0.0–0.1)
Immature Granulocytes: 0 %
LYMPHS ABS: 1.8 10*3/uL (ref 0.7–3.1)
Lymphs: 36 %
MCH: 32.7 pg (ref 26.6–33.0)
MCHC: 35.8 g/dL — AB (ref 31.5–35.7)
MCV: 91 fL (ref 79–97)
MONOCYTES: 5 %
MONOS ABS: 0.3 10*3/uL (ref 0.1–0.9)
Neutrophils Absolute: 2.6 10*3/uL (ref 1.4–7.0)
Neutrophils: 55 %
Platelets: 197 10*3/uL (ref 150–379)
RBC: 5.11 x10E6/uL (ref 4.14–5.80)
RDW: 13.4 % (ref 12.3–15.4)
WBC: 4.9 10*3/uL (ref 3.4–10.8)

## 2016-04-07 LAB — TSH: TSH: 2.32 u[IU]/mL (ref 0.450–4.500)

## 2016-04-07 LAB — PSA: Prostate Specific Ag, Serum: 1 ng/mL (ref 0.0–4.0)

## 2016-04-07 NOTE — Telephone Encounter (Signed)
Advised pt of lab results. Pt verbally acknowledges understanding. Savvy Peeters Drozdowski, CMA   

## 2016-04-07 NOTE — Telephone Encounter (Signed)
-----   Message from Maple Hudsonichard L Gilbert Jr., MD sent at 04/07/2016 10:44 AM EDT ----- Labs stable.

## 2016-06-23 ENCOUNTER — Encounter: Payer: Self-pay | Admitting: Emergency Medicine

## 2016-06-23 ENCOUNTER — Ambulatory Visit
Admission: EM | Admit: 2016-06-23 | Discharge: 2016-06-23 | Disposition: A | Discharge status: Home / Self Care | Payer: Managed Care, Other (non HMO) | Attending: Emergency Medicine | Admitting: Emergency Medicine

## 2016-06-23 DIAGNOSIS — J00 Acute nasopharyngitis [common cold]: Secondary | ICD-10-CM

## 2016-06-23 LAB — RAPID STREP SCREEN (MED CTR MEBANE ONLY): STREPTOCOCCUS, GROUP A SCREEN (DIRECT): NEGATIVE

## 2016-06-23 MED ORDER — IPRATROPIUM BROMIDE 0.06 % NA SOLN: 2.0000 | Freq: Four times a day (QID) | NASAL | 0 refills | Status: DC

## 2016-06-23 NOTE — ED Triage Notes (Signed)
Patient c/o sore throat for the past 4 days.  Patient denies fevers.  

## 2016-06-23 NOTE — Discharge Instructions (Signed)
You may take 800 mg of motrin with 1 gram of tylenol up to 3 times a day as needed for pain. This is an effective combination for pain.   Use a neti pot or the NeilMed sinus rinse as often as you want to to reduce nasal congestion. Follow the directions on the box.   Start some Mucinex D as well.  Go to www.goodrx.com to look up your medications. This will give you a list of where you can find your prescriptions at the most affordable prices.

## 2016-06-23 NOTE — ED Provider Notes (Signed)
HPI  SUBJECTIVE:  Patient reports sore throat starting 3-4 days ago. Symptoms are better with OTC cold medicines, no aggravating factors.  No fever    + Cough/URI sxs with nasal congestion, rhinorrhea, postnasal drip and a slight cough No Myalgias No Headache No Rash     No Recent Strep Exposure No Abdominal Pain No reflux sxs No Allergy sxs  No Breathing difficulty, muffled hot potato voice No Drooling No Trismus No abx in past month.  No antipyretic in past 4-6 hrs  Past medical history negative for diabetes, hypertension, strep. positive for GERD and allergies. JYN:[id number]:Richard Wendelyn BreslowGilbert Jr, MD    Past Medical History:  Diagnosis Date  . Allergy   . GERD (gastroesophageal reflux disease)   . Hyperlipidemia     Past Surgical History:  Procedure Laterality Date  . NO PAST SURGERIES    . VASECTOMY      Family History  Problem Relation Age of Onset  . Osteoporosis Mother     Social History  Substance Use Topics  . Smoking status: Never Smoker  . Smokeless tobacco: Never Used  . Alcohol use No    No current facility-administered medications for this encounter.   Current Outpatient Prescriptions:  .  aspirin 81 MG tablet, Take by mouth daily., Disp: , Rfl:  .  cholecalciferol (VITAMIN D) 1000 units tablet, Take 1,000 Units by mouth daily., Disp: , Rfl:  .  ipratropium (ATROVENT) 0.06 % nasal spray, Place 2 sprays into both nostrils 4 (four) times daily. 3-4 times/ day, Disp: 15 mL, Rfl: 0 .  simvastatin (ZOCOR) 40 MG tablet, Take 1 tablet (40 mg total) by mouth at bedtime., Disp: 90 tablet, Rfl: 2 .  vitamin B-12 (CYANOCOBALAMIN) 1000 MCG tablet, Take 1,000 mcg by mouth daily., Disp: , Rfl:   No Known Allergies   ROS  As noted in HPI.   Physical Exam  BP 139/89 (BP Location: Left Arm)   Pulse 83   Temp 98.8 F (37.1 C) (Oral)   Resp 16   Ht 5\' 9"  (1.753 m)   Wt 183 lb (83 kg)   SpO2 100%   BMI 27.02 kg/m   Constitutional: Well developed, well  nourished, no acute distress Eyes:  EOMI, conjunctiva normal bilaterally HENT: Normocephalic, atraumatic,mucus membranes moist.TMs normal bilaterally +  nasal congestion, erythematous swollen turbinates. -Sinus tenderness + slightly erythematous oropharynx - enlarged tonsils  - exudates. Uvula midline. + Postnasal drip Respiratory: Normal inspiratory effort Cardiovascular: Normal rate, no murmurs, rubs, gallops GI: nondistended, nontender. No appreciable splenomegaly skin: No rash, skin intact Lymph: - cervical LN  Musculoskeletal: no deformities Neurologic: Alert & oriented x 3, no focal neuro deficits Psychiatric: Speech and behavior appropriate.   ED Course   Medications - No data to display  Orders Placed This Encounter  Procedures  . Rapid strep screen    Standing Status:   Standing    Number of Occurrences:   1  . Culture, group A strep    Standing Status:   Standing    Number of Occurrences:   1    Results for orders placed or performed during the hospital encounter of 06/23/16 (from the past 24 hour(s))  Rapid strep screen     Status: None   Collection Time: 06/23/16 10:47 AM  Result Value Ref Range   Streptococcus, Group A Screen (Direct) NEGATIVE NEGATIVE   No results found.  ED Clinical Impression  Acute nasopharyngitis   ED Assessment/Plan  Presentation consistent with a viral  pharyngitis/URI., Saline nasal irrigation, Mucinex D, Atrovent nasal spray. He declined a prescription for ibuprofen states the sore throat is "not that bad".  Rapid strep negative. Obtaining throat culture to guide antibiotic treatment. Discussed this with patient/. We'll contact them if culture is positive, and will call in Appropriate antibiotics. Patient to followup with PMD when necessary.   Discussed labs, MDM, plan and followup with patient. Patient agrees with plan.   Meds ordered this encounter  Medications  . ipratropium (ATROVENT) 0.06 % nasal spray    Sig: Place 2  sprays into both nostrils 4 (four) times daily. 3-4 times/ day    Dispense:  15 mL    Refill:  0     *This clinic note was created using Scientist, clinical (histocompatibility and immunogenetics)Dragon dictation software. Therefore, there may be occasional mistakes despite careful proofreading.    Domenick GongAshley Brandey Vandalen, MD 06/23/16 [phone]

## 2016-06-26 ENCOUNTER — Telehealth: Payer: Self-pay

## 2016-06-26 LAB — CULTURE, GROUP A STREP (THRC)

## 2016-12-27 ENCOUNTER — Other Ambulatory Visit: Payer: Self-pay | Admitting: Family Medicine

## 2017-02-21 ENCOUNTER — Other Ambulatory Visit: Payer: Self-pay | Admitting: Family Medicine

## 2017-04-20 ENCOUNTER — Encounter: Payer: Self-pay | Admitting: Family Medicine

## 2017-04-25 ENCOUNTER — Other Ambulatory Visit: Payer: Self-pay | Admitting: Physician Assistant

## 2017-06-30 ENCOUNTER — Encounter: Payer: Self-pay | Admitting: Family Medicine

## 2017-06-30 ENCOUNTER — Ambulatory Visit (INDEPENDENT_AMBULATORY_CARE_PROVIDER_SITE_OTHER): Payer: Managed Care, Other (non HMO) | Admitting: Family Medicine

## 2017-06-30 VITALS — BP 114/62 | HR 84 | Temp 98.0°F | Resp 16 | Ht 69.0 in | Wt 193.0 lb

## 2017-06-30 DIAGNOSIS — Z0000 Encounter for general adult medical examination without abnormal findings: Secondary | ICD-10-CM

## 2017-06-30 DIAGNOSIS — J309 Allergic rhinitis, unspecified: Secondary | ICD-10-CM | POA: Diagnosis not present

## 2017-06-30 DIAGNOSIS — E785 Hyperlipidemia, unspecified: Secondary | ICD-10-CM

## 2017-06-30 DIAGNOSIS — J019 Acute sinusitis, unspecified: Secondary | ICD-10-CM

## 2017-06-30 DIAGNOSIS — Z125 Encounter for screening for malignant neoplasm of prostate: Secondary | ICD-10-CM | POA: Diagnosis not present

## 2017-06-30 DIAGNOSIS — Z23 Encounter for immunization: Secondary | ICD-10-CM | POA: Diagnosis not present

## 2017-06-30 DIAGNOSIS — Z1211 Encounter for screening for malignant neoplasm of colon: Secondary | ICD-10-CM | POA: Diagnosis not present

## 2017-06-30 MED ORDER — LORATADINE 10 MG PO TABS: 10.0000 mg | ORAL_TABLET | Freq: Every day | ORAL | 11 refills | Status: DC

## 2017-06-30 MED ORDER — SIMVASTATIN 40 MG PO TABS: 40.0000 mg | ORAL_TABLET | Freq: Every day | ORAL | 3 refills | Status: DC

## 2017-06-30 MED ORDER — FLUTICASONE PROPIONATE 50 MCG/ACT NA SUSP: 2.0000 | Freq: Every day | NASAL | 1 refills | Status: DC

## 2017-06-30 NOTE — Progress Notes (Signed)
Patient: Alan Schultz, Male    DOB: 05-14-1966, 51 y.o.   MRN: [id number] Visit Date: 06/30/2017  Today's Provider: Megan Mansichard Ziyon Soltau Jr, MD   Chief Complaint  Patient presents with  . Annual Exam   Subjective:    Annual physical exam Alan GobbleRicky L Schultz is a 51 y.o. male who presents today for health maintenance and complete physical. He feels well. He reports he is not exercising. He reports he is sleeping fairly well.  -----------------------------------------------------------------  Colonoscopy- 05/09/14 internal hemorrhoids    Review of Systems  Constitutional: Negative.   HENT: Negative.        Allergies with specific issues with sinus congestion  Eyes: Negative.   Respiratory: Negative.   Cardiovascular: Negative.   Gastrointestinal: Negative.   Endocrine: Negative.   Genitourinary: Negative.   Musculoskeletal: Negative.        Mild stiffness in his neck over time  Skin: Negative.   Allergic/Immunologic: Negative.   Neurological: Negative.   Hematological: Negative.   Psychiatric/Behavioral: Negative.     Social History      He  reports that  has never smoked. he has never used smokeless tobacco. He reports that he does not drink alcohol or use drugs.       Social History   Socioeconomic History  . Marital status: Married    Spouse name: None  . Number of children: None  . Years of education: None  . Highest education level: None  Social Needs  . Financial resource strain: None  . Food insecurity - worry: None  . Food insecurity - inability: None  . Transportation needs - medical: None  . Transportation needs - non-medical: None  Occupational History  . None  Tobacco Use  . Smoking status: Never Smoker  . Smokeless tobacco: Never Used  Substance and Sexual Activity  . Alcohol use: No    Alcohol/week: 0.0 oz  . Drug use: No  . Sexual activity: None  Other Topics Concern  . None  Social History Narrative  . None    Past Medical  History:  Diagnosis Date  . Allergy   . GERD (gastroesophageal reflux disease)   . Hyperlipidemia      Patient Active Problem List   Diagnosis Date Noted  . Allergic rhinitis 01/11/2015  . Chronic LBP 01/11/2015  . Dermatitis, eczematoid 01/11/2015  . Acid reflux 01/11/2015  . HLD (hyperlipidemia) 01/11/2015  . Idiopathic peripheral neuropathy 01/11/2015  . Restless leg 01/11/2015  . Herpes zoster 01/11/2015  . Avitaminosis D 01/11/2015    Past Surgical History:  Procedure Laterality Date  . NO PAST SURGERIES    . VASECTOMY      Family History        Family Status  Relation Name Status  . Mother  Alive  . Father  Alive  . Sister  Alive        His family history includes Osteoporosis in his mother.     No Known Allergies   Current Outpatient Medications:  .  aspirin 81 MG tablet, Take by mouth daily., Disp: , Rfl:  .  rOPINIRole (REQUIP) 1 MG tablet, TAKE TWO TABLETS AT BEDTIME., Disp: 60 tablet, Rfl: 5 .  simvastatin (ZOCOR) 40 MG tablet, Take 1 tablet (40 mg total) by mouth at bedtime., Disp: 90 tablet, Rfl: 3 .  cholecalciferol (VITAMIN D) 1000 units tablet, Take 1,000 Units by mouth daily., Disp: , Rfl:  .  ipratropium (ATROVENT) 0.06 % nasal spray,  Place 2 sprays into both nostrils 4 (four) times daily. 3-4 times/ day (Patient not taking: Reported on 06/30/2017), Disp: 15 mL, Rfl: 0 .  vitamin B-12 (CYANOCOBALAMIN) 1000 MCG tablet, Take 1,000 mcg by mouth daily., Disp: , Rfl:    Patient Care Team: Maple HudsonGilbert, Iren Whipp L Jr., MD as PCP - General (Family Medicine)      Objective:   Vitals: BP 114/62 (BP Location: Left Arm, Patient Position: Sitting, Cuff Size: Normal)   Pulse 84   Temp 98 F (36.7 C) (Oral)   Resp 16   Ht 5\' 9"  (1.753 m)   Wt 193 lb (87.5 kg)   BMI 28.50 kg/m    Vitals:   06/30/17 1409  BP: 114/62  Pulse: 84  Resp: 16  Temp: 98 F (36.7 C)  TempSrc: Oral  Weight: 193 lb (87.5 kg)  Height: 5\' 9"  (1.753 m)     Physical Exam    Constitutional: He is oriented to person, place, and time. He appears well-developed and well-nourished.  HENT:  Head: Normocephalic and atraumatic.  Right Ear: External ear normal.  Left Ear: External ear normal.  Nose: Nose normal.  Mouth/Throat: Oropharynx is clear and moist.  Eyes: Conjunctivae are normal. No scleral icterus.  Neck: No thyromegaly present.  Cardiovascular: Normal rate, regular rhythm, normal heart sounds and intact distal pulses.  Pulmonary/Chest: Effort normal and breath sounds normal.  Abdominal: Soft.  Lymphadenopathy:    He has no cervical adenopathy.  Neurological: He is alert and oriented to person, place, and time.  Skin: Skin is warm and dry.  Psychiatric: He has a normal mood and affect. His behavior is normal. Judgment and thought content normal.     Depression Screen PHQ 2/9 Scores 06/30/2017 04/06/2016 03/18/2015  PHQ - 2 Score 0 0 0      Assessment & Plan:     Routine Health Maintenance and Physical Exam  Exercise Activities and Dietary recommendations Goals    None      Immunization History  Administered Date(s) Administered  . Td 05/20/1999  . Tdap 01/05/2006    Health Maintenance  Topic Date Due  . HIV Screening  03/11/1979  . TETANUS/TDAP  01/06/2016  . INFLUENZA VACCINE  02/16/2017  . COLONOSCOPY  05/09/2024  . Hepatitis C Screening  Completed     Discussed health benefits of physical activity, and encouraged him to engage in regular exercise appropriate for his age and condition.  Actinic keratosis Per Dr. Adolphus Birchwoodasher Allergic rhinitis Try Claritin 10 mg and fluticasone nasal spray.    --------------------------------------------------------------------   I have done the exam and reviewed the above chart and it is accurate to the best of my knowledge. DentistDragon  technology has been used in this note in any air is in the dictation or transcription are unintentional.  Megan Mansichard Delta Deshmukh Jr, MD  [facility]

## 2017-07-05 ENCOUNTER — Encounter: Payer: Self-pay | Admitting: Family Medicine

## 2017-07-08 LAB — CBC WITH DIFFERENTIAL/PLATELET
BASOS PCT: 1 %
Basophils Absolute: 53 cells/uL (ref 0–200)
EOS ABS: 201 {cells}/uL (ref 15–500)
EOS PCT: 3.8 %
HCT: 46.7 % (ref 38.5–50.0)
HEMOGLOBIN: 16.2 g/dL (ref 13.2–17.1)
Lymphs Abs: 1988 cells/uL (ref 850–3900)
MCH: 32.1 pg (ref 27.0–33.0)
MCHC: 34.7 g/dL (ref 32.0–36.0)
MCV: 92.5 fL (ref 80.0–100.0)
MONOS PCT: 9.7 %
MPV: 9.9 fL (ref 7.5–12.5)
NEUTROS ABS: 2544 {cells}/uL (ref 1500–7800)
Neutrophils Relative %: 48 %
Platelets: 193 10*3/uL (ref 140–400)
RBC: 5.05 10*6/uL (ref 4.20–5.80)
RDW: 12.1 % (ref 11.0–15.0)
Total Lymphocyte: 37.5 %
WBC mixed population: 514 cells/uL (ref 200–950)
WBC: 5.3 10*3/uL (ref 3.8–10.8)

## 2017-07-08 LAB — COMPLETE METABOLIC PANEL WITH GFR
AG RATIO: 1.7 (calc) (ref 1.0–2.5)
ALKALINE PHOSPHATASE (APISO): 55 U/L (ref 40–115)
ALT: 21 U/L (ref 9–46)
AST: 21 U/L (ref 10–35)
Albumin: 4.5 g/dL (ref 3.6–5.1)
BUN: 16 mg/dL (ref 7–25)
CO2: 28 mmol/L (ref 20–32)
Calcium: 9.5 mg/dL (ref 8.6–10.3)
Chloride: 103 mmol/L (ref 98–110)
Creat: 1.09 mg/dL (ref 0.70–1.33)
GFR, EST NON AFRICAN AMERICAN: 77 mL/min/{1.73_m2} (ref >=60)
GFR, Est African American: 89 mL/min/{1.73_m2} (ref >=60)
GLOBULIN: 2.6 g/dL (ref 1.9–3.7)
Glucose, Bld: 100 mg/dL — ABNORMAL HIGH (ref 65–99)
POTASSIUM: 4.1 mmol/L (ref 3.5–5.3)
SODIUM: 141 mmol/L (ref 135–146)
Total Bilirubin: 1.2 mg/dL (ref 0.2–1.2)
Total Protein: 7.1 g/dL (ref 6.1–8.1)

## 2017-07-08 LAB — LIPID PANEL
CHOL/HDL RATIO: 3.8 (calc) (ref <5.0)
CHOLESTEROL: 205 mg/dL — AB (ref <200)
HDL: 54 mg/dL (ref >40)
LDL CHOLESTEROL (CALC): 129 mg/dL — AB
Non-HDL Cholesterol (Calc): 151 mg/dL (calc) — ABNORMAL HIGH (ref <130)
TRIGLYCERIDES: 108 mg/dL (ref <150)

## 2017-07-08 LAB — PSA: PSA: 0.8 ng/mL (ref <=4.0)

## 2017-07-14 ENCOUNTER — Telehealth: Payer: Self-pay | Admitting: Family Medicine

## 2017-07-14 NOTE — Telephone Encounter (Signed)
returning call about labs

## 2017-08-11 ENCOUNTER — Ambulatory Visit
Admission: RE | Admit: 2017-08-11 | Discharge: 2017-08-11 | Disposition: A | Discharge status: Home / Self Care | Payer: Managed Care, Other (non HMO) | Source: Ambulatory Visit | Attending: Family Medicine | Admitting: Family Medicine

## 2017-08-11 ENCOUNTER — Ambulatory Visit (INDEPENDENT_AMBULATORY_CARE_PROVIDER_SITE_OTHER): Payer: Managed Care, Other (non HMO) | Admitting: Family Medicine

## 2017-08-11 ENCOUNTER — Encounter: Payer: Self-pay | Admitting: Family Medicine

## 2017-08-11 VITALS — BP 108/68 | HR 90 | Temp 98.7°F | Resp 16 | Wt 190.0 lb

## 2017-08-11 DIAGNOSIS — R935 Abnormal findings on diagnostic imaging of other abdominal regions, including retroperitoneum: Secondary | ICD-10-CM | POA: Diagnosis present

## 2017-08-11 DIAGNOSIS — R1013 Epigastric pain: Secondary | ICD-10-CM

## 2017-08-11 DIAGNOSIS — K219 Gastro-esophageal reflux disease without esophagitis: Secondary | ICD-10-CM

## 2017-08-11 MED ORDER — SUCRALFATE 1 G PO TABS: 1.0000 g | ORAL_TABLET | Freq: Three times a day (TID) | ORAL | 1 refills | Status: DC

## 2017-08-11 MED ORDER — OMEPRAZOLE 20 MG PO CPDR: 20.0000 mg | DELAYED_RELEASE_CAPSULE | Freq: Every day | ORAL | 1 refills | Status: DC

## 2017-08-11 NOTE — Progress Notes (Signed)
Patient: Alan Schultz Male    DOB: 1965/08/31   52 y.o.   MRN: [id number] Visit Date: 08/11/2017  Today's Provider: Shirlee LatchAngela Jaylun Fleener, MD   I, Joslyn HyEmily Ratchford, CMA, am acting as scribe for Shirlee LatchAngela Stephenie Navejas, MD.  Chief Complaint  Patient presents with  . Abdominal Pain   Subjective:    Abdominal Pain  This is a new problem. The current episode started yesterday. The problem occurs constantly. The problem has been unchanged. The pain is located in the generalized abdominal region. The pain is at a severity of 4/10. The pain is moderate. The quality of the pain is aching. The abdominal pain does not radiate. Associated symptoms include anorexia. Pertinent negatives include no arthralgias, belching, constipation, diarrhea, dysuria, fever, flatus, frequency, headaches, hematochezia, hematuria, melena, myalgias, nausea, vomiting or weight loss. Nothing aggravates the pain. Treatments tried: antacids. The treatment provided no relief. His past medical history is significant for GERD. There is no history of abdominal surgery, colon cancer, Crohn's disease, gallstones, irritable bowel syndrome, pancreatitis or ulcerative colitis.   Pain started ~1-1.5 hours after eating a bacon sandwich last night for dinner.  States pain has been constant and not changed, depite antacids.  He was unable to sleep due to pain. Never had pain like this before.  Previously took omeprazole for GERD - hasn't needed in years.  That pain was epigastric.  This pain is between umbilicus and epigastrium.      No Known Allergies   Current Outpatient Medications:  .  rOPINIRole (REQUIP) 1 MG tablet, TAKE TWO TABLETS AT BEDTIME., Disp: 60 tablet, Rfl: 5 .  simvastatin (ZOCOR) 40 MG tablet, Take 1 tablet (40 mg total) by mouth at bedtime., Disp: 90 tablet, Rfl: 3 .  omeprazole (PRILOSEC) 20 MG capsule, Take 1 capsule (20 mg total) by mouth daily., Disp: 30 capsule, Rfl: 1 .  sucralfate (CARAFATE) 1 g tablet, Take 1  tablet (1 g total) by mouth 4 (four) times daily -  with meals and at bedtime., Disp: 120 tablet, Rfl: 1  Review of Systems  Constitutional: Negative for fever and weight loss.  Gastrointestinal: Positive for abdominal pain and anorexia. Negative for constipation, diarrhea, flatus, hematochezia, melena, nausea and vomiting.  Genitourinary: Negative for dysuria, frequency and hematuria.  Musculoskeletal: Negative for arthralgias and myalgias.  Neurological: Negative for headaches.    Social History   Tobacco Use  . Smoking status: Never Smoker  . Smokeless tobacco: Never Used  Substance Use Topics  . Alcohol use: No    Alcohol/week: 0.0 oz   Objective:   BP 108/68 (BP Location: Left Arm, Patient Position: Sitting, Cuff Size: Large)   Pulse 90   Temp 98.7 F (37.1 C) (Oral)   Resp 16   Wt 190 lb (86.2 kg)   SpO2 95%   BMI 28.06 kg/m  Vitals:   08/11/17 0952  BP: 108/68  Pulse: 90  Resp: 16  Temp: 98.7 F (37.1 C)  TempSrc: Oral  SpO2: 95%  Weight: 190 lb (86.2 kg)     Physical Exam  Constitutional: He is oriented to person, place, and time. He appears well-developed and well-nourished. No distress.  HENT:  Head: Normocephalic and atraumatic.  Eyes: Conjunctivae are normal.  Neck: Neck supple.  Cardiovascular: Normal rate, regular rhythm, normal heart sounds and intact distal pulses.  No murmur heard. Pulmonary/Chest: Effort normal and breath sounds normal. No respiratory distress. He has no wheezes. He has no rales.  Abdominal: Soft.  Bowel sounds are normal. He exhibits no distension. There is tenderness (mild TTP on deep palpation of area between umbilicus and epigastrium). There is no rebound and no guarding.  Negative Murphy's sign.  No TTP over McBurney's point.  Musculoskeletal: He exhibits no edema or deformity.  Lymphadenopathy:    He has no cervical adenopathy.  Neurological: He is alert and oriented to person, place, and time.  Skin: Skin is warm and  dry. No rash noted.  Psychiatric: He has a normal mood and affect. His behavior is normal.  Vitals reviewed.       Assessment & Plan:       1. Epigastric pain - pain is in low epigastrium - with no other symptoms other than pain, differential includes GERD/gastritis, pancreatitis, constipation, possibly gallbladder pathology - less likely gallbladder given no RUQ pain , negative exam, and constant, non-colicky pain - will check basic labs for biliary pathology, pancreatitis - get Abd XRay to see if constipation/bowel gas is an issue - trial of treatment for possible GERD as below - strict return precautions discussed - Lipase - Comprehensive metabolic panel - DG Abd 2 Views; Future  2. Gastroesophageal reflux disease, esophagitis presence not specified - possible GERD symptoms - will try carafate for short term pain and omeprazole course to see if this helps as GERD could be causing abd pain - return precautions discussed   Meds ordered this encounter  Medications  . omeprazole (PRILOSEC) 20 MG capsule    Sig: Take 1 capsule (20 mg total) by mouth daily.    Dispense:  30 capsule    Refill:  1  . sucralfate (CARAFATE) 1 g tablet    Sig: Take 1 tablet (1 g total) by mouth 4 (four) times daily -  with meals and at bedtime.    Dispense:  120 tablet    Refill:  1     Return if symptoms worsen or fail to improve.   The entirety of the information documented in the History of Present Illness, Review of Systems and Physical Exam were personally obtained by me. Portions of this information were initially documented by Irving Burton Ratchford, CMA and reviewed by me for thoroughness and accuracy.    Erasmo Downer, MD, MPH [facility] 08/11/2017 11:46 AM

## 2017-08-11 NOTE — Patient Instructions (Signed)

## 2017-08-12 ENCOUNTER — Telehealth: Payer: Self-pay

## 2017-08-12 LAB — COMPREHENSIVE METABOLIC PANEL
ALBUMIN: 4.7 g/dL (ref 3.5–5.5)
ALT: 19 IU/L (ref 0–44)
AST: 15 IU/L (ref 0–40)
Albumin/Globulin Ratio: 1.9 (ref 1.2–2.2)
Alkaline Phosphatase: 71 IU/L (ref 39–117)
BUN / CREAT RATIO: 15 (ref 9–20)
BUN: 16 mg/dL (ref 6–24)
Bilirubin Total: 1.1 mg/dL (ref 0.0–1.2)
CO2: 22 mmol/L (ref 20–29)
CREATININE: 1.08 mg/dL (ref 0.76–1.27)
Calcium: 9.6 mg/dL (ref 8.7–10.2)
Chloride: 103 mmol/L (ref 96–106)
GFR calc Af Amer: 90 mL/min/{1.73_m2} (ref >59)
GFR calc non Af Amer: 78 mL/min/{1.73_m2} (ref >59)
GLOBULIN, TOTAL: 2.5 g/dL (ref 1.5–4.5)
GLUCOSE: 108 mg/dL — AB (ref 65–99)
Potassium: 4.1 mmol/L (ref 3.5–5.2)
SODIUM: 140 mmol/L (ref 134–144)
TOTAL PROTEIN: 7.2 g/dL (ref 6.0–8.5)

## 2017-08-12 LAB — LIPASE: Lipase: 28 U/L (ref 13–78)

## 2017-08-12 NOTE — Telephone Encounter (Signed)
lmtcb

## 2017-08-12 NOTE — Telephone Encounter (Signed)
-----   Message from Erasmo DownerAngela M Bacigalupo, MD sent at 08/12/2017 10:24 AM EST ----- Normal kidney function, liver function, electrolytes, and pancreas function (lipase).  See note also for Abd Kellie MoorXRay  Bacigalupo, Marzella SchleinAngela M, MD, MPH [facility] 08/12/2017 10:24 AM

## 2017-08-12 NOTE — Telephone Encounter (Signed)
-----   Message from Erasmo DownerAngela M Bacigalupo, MD sent at 08/12/2017 10:29 AM EST ----- Nonspecific abdominal XRay.  There are undigested pills in the stomach, which can happen with GERD/indigestion.  Also with gas in small intestines, which could be seen with early gastroenteritis.  If symptoms continue or worsen or develop symptoms of obstruction (not passing any stool and vomiting), could need CT scan.  No change in plan from yesterday  Bacigalupo, Marzella SchleinAngela M, MD, MPH [facility] 08/12/2017 10:29 AM

## 2017-08-12 NOTE — Telephone Encounter (Signed)
Left message advising pt. OK per DPR. 

## 2017-08-15 NOTE — Telephone Encounter (Signed)
Pt advised.   Thanks,   -Laura  

## 2018-01-10 ENCOUNTER — Other Ambulatory Visit: Payer: Self-pay | Admitting: Family Medicine

## 2018-01-10 DIAGNOSIS — E785 Hyperlipidemia, unspecified: Secondary | ICD-10-CM

## 2018-05-12 ENCOUNTER — Other Ambulatory Visit: Payer: Self-pay | Admitting: Family Medicine

## 2018-06-14 ENCOUNTER — Other Ambulatory Visit: Payer: Self-pay | Admitting: Family Medicine

## 2018-06-14 NOTE — Telephone Encounter (Signed)
[facility] refill request for the following medications:  omeprazole (PRILOSEC) 20 MG capsule  Qty: 30  Date written: 08/11/2017  Last dispensed: 05/22/2018  Please advise.

## 2018-06-16 MED ORDER — OMEPRAZOLE 20 MG PO CPDR: 20.0000 mg | DELAYED_RELEASE_CAPSULE | Freq: Every day | ORAL | 1 refills | Status: DC

## 2018-07-03 ENCOUNTER — Encounter: Payer: Self-pay | Admitting: Family Medicine

## 2018-08-08 ENCOUNTER — Other Ambulatory Visit: Payer: Self-pay | Admitting: Family Medicine

## 2018-08-08 DIAGNOSIS — E785 Hyperlipidemia, unspecified: Secondary | ICD-10-CM

## 2018-08-16 ENCOUNTER — Ambulatory Visit (INDEPENDENT_AMBULATORY_CARE_PROVIDER_SITE_OTHER): Payer: BLUE CROSS/BLUE SHIELD | Admitting: Family Medicine

## 2018-08-16 VITALS — BP 129/82 | HR 68 | Temp 98.6°F | Resp 16 | Ht 69.0 in | Wt 200.0 lb

## 2018-08-16 DIAGNOSIS — J309 Allergic rhinitis, unspecified: Secondary | ICD-10-CM

## 2018-08-16 DIAGNOSIS — G2581 Restless legs syndrome: Secondary | ICD-10-CM | POA: Diagnosis not present

## 2018-08-16 DIAGNOSIS — K219 Gastro-esophageal reflux disease without esophagitis: Secondary | ICD-10-CM

## 2018-08-16 DIAGNOSIS — Z125 Encounter for screening for malignant neoplasm of prostate: Secondary | ICD-10-CM | POA: Diagnosis not present

## 2018-08-16 DIAGNOSIS — Z0000 Encounter for general adult medical examination without abnormal findings: Secondary | ICD-10-CM | POA: Diagnosis not present

## 2018-08-16 MED ORDER — OMEPRAZOLE 20 MG PO CPDR: 20.0000 mg | DELAYED_RELEASE_CAPSULE | Freq: Two times a day (BID) | ORAL | 12 refills | Status: DC

## 2018-08-16 MED ORDER — FLUTICASONE PROPIONATE 50 MCG/ACT NA SUSP: 2.0000 | Freq: Every day | NASAL | 6 refills | Status: DC

## 2018-08-16 MED ORDER — ROPINIROLE HCL 2 MG PO TABS: 2.0000 mg | ORAL_TABLET | Freq: Every day | ORAL | 12 refills | Status: DC

## 2018-08-16 NOTE — Progress Notes (Signed)
Patient: Alan Schultz, Male    DOB: 08-15-1965, 53 y.o.   MRN: [id number] Visit Date: 08/16/2018  Today's Provider: Megan Mansichard Mairyn Lenahan Jr, MD   Chief Complaint  Patient presents with  . Annual Exam   Subjective:  Alan Schultz is a 53 y.o. male who presents today for health maintenance and complete physical. He feels well. He reports exercising 4-5 times weekly. He reports he is sleeping poorly.  Patient states he is under increased stress at work and this is affecting his sleep. Problems recently with this restless leg syndrome despite ropinirole.  Still having some breakthrough reflux.  He also has postnasal drainage causing congestion and a mild cough.  Immunization History  Administered Date(s) Administered  . Influenza,inj,Quad PF,6+ Mos 06/30/2017  . Td 05/20/1999, 06/30/2017  . Tdap 01/05/2006   05/09/14 Colonoscopy-hemorrhoids  Review of Systems  Constitutional: Negative.   HENT: Negative.   Eyes: Negative.   Respiratory: Negative.   Cardiovascular: Negative.   Gastrointestinal: Negative.        Acid reflux   Endocrine: Negative.   Genitourinary: Negative.   Musculoskeletal: Negative.   Skin: Negative.   Allergic/Immunologic: Negative.   Neurological: Negative.   Hematological: Negative.   Psychiatric/Behavioral: Negative.     Social History   Socioeconomic History  . Marital status: Married    Spouse name: Not on file  . Number of children: Not on file  . Years of education: Not on file  . Highest education level: Not on file  Occupational History  . Not on file  Social Needs  . Financial resource strain: Not on file  . Food insecurity:    Worry: Not on file    Inability: Not on file  . Transportation needs:    Medical: Not on file    Non-medical: Not on file  Tobacco Use  . Smoking status: Never Smoker  . Smokeless tobacco: Never Used  Substance and Sexual Activity  . Alcohol use: No    Alcohol/week: 0.0 standard drinks  . Drug use: No  . Sexual  activity: Not on file  Lifestyle  . Physical activity:    Days per week: Not on file    Minutes per session: Not on file  . Stress: Not on file  Relationships  . Social connections:    Talks on phone: Not on file    Gets together: Not on file    Attends religious service: Not on file    Active member of club or organization: Not on file    Attends meetings of clubs or organizations: Not on file    Relationship status: Not on file  . Intimate partner violence:    Fear of current or ex partner: Not on file    Emotionally abused: Not on file    Physically abused: Not on file    Forced sexual activity: Not on file  Other Topics Concern  . Not on file  Social History Narrative  . Not on file    Patient Active Problem List   Diagnosis Date Noted  . Allergic rhinitis 01/11/2015  . Chronic LBP 01/11/2015  . Dermatitis, eczematoid 01/11/2015  . Acid reflux 01/11/2015  . HLD (hyperlipidemia) 01/11/2015  . Idiopathic peripheral neuropathy 01/11/2015  . Restless leg 01/11/2015  . Herpes zoster 01/11/2015  . Avitaminosis D 01/11/2015    Past Surgical History:  Procedure Laterality Date  . NO PAST SURGERIES    . VASECTOMY      His family history includes Osteoporosis  in his mother.     Outpatient Encounter Medications as of 08/16/2018  Medication Sig  . omeprazole (PRILOSEC) 20 MG capsule Take 1 capsule (20 mg total) by mouth daily.  [name] rOPINIRole (REQUIP) 1 MG tablet TAKE TWO TABLETS AT BEDTIME.  . simvastatin (ZOCOR) 40 MG tablet Take 1 tablet (40 mg total) by mouth at bedtime.  . sucralfate (CARAFATE) 1 g tablet Take 1 tablet (1 g total) by mouth 4 (four) times daily -  with meals and at bedtime.   No facility-administered encounter medications on file as of 08/16/2018.     Patient Care Team: Maple Hudson., MD as PCP - General (Family Medicine)      Objective:   Vitals:  Vitals:   08/16/18 0928  BP: 129/82  Pulse: 68  Resp: 16  Temp: 98.6 F (37 C)   TempSrc: Oral  Weight: 200 lb (90.7 kg)  Height: 5\' 9"  (1.753 m)    Physical Exam Constitutional:      Appearance: Normal appearance. He is normal weight.  HENT:     Head: Normocephalic and atraumatic.     Right Ear: Tympanic membrane, ear canal and external ear normal.     Left Ear: Tympanic membrane, ear canal and external ear normal.     Nose: Nose normal.     Mouth/Throat:     Mouth: Mucous membranes are moist.     Pharynx: Oropharynx is clear.  Eyes:     Extraocular Movements: Extraocular movements intact.     Conjunctiva/sclera: Conjunctivae normal.     Pupils: Pupils are equal, round, and reactive to light.  Neck:     Musculoskeletal: Normal range of motion and neck supple.  Cardiovascular:     Rate and Rhythm: Normal rate and regular rhythm.     Pulses: Normal pulses.     Heart sounds: Normal heart sounds.  Pulmonary:     Effort: Pulmonary effort is normal.     Breath sounds: Normal breath sounds.  Abdominal:     General: Abdomen is flat. Bowel sounds are normal.     Palpations: Abdomen is soft.  Genitourinary:    Penis: Normal.      Scrotum/Testes: Normal.     Prostate: Normal.     Rectum: Normal.  Musculoskeletal: Normal range of motion.  Skin:    General: Skin is warm and dry.  Neurological:     General: No focal deficit present.     Mental Status: He is alert and oriented to person, place, and time. Mental status is at baseline.  Psychiatric:        Mood and Affect: Mood normal.        Behavior: Behavior normal.        Thought Content: Thought content normal.        Judgment: Judgment normal.      Depression Screen PHQ 2/9 Scores 08/16/2018 06/30/2017 04/06/2016 03/18/2015  PHQ - 2 Score 0 0 0 0      Assessment & Plan:     Routine Health Maintenance and Physical Exam  Exercise Activities and Dietary recommendations Goals   None     Immunization History  Administered Date(s) Administered  . Influenza,inj,Quad PF,6+ Mos 06/30/2017  .  Td 05/20/1999, 06/30/2017  . Tdap 01/05/2006    Health Maintenance  Topic Date Due  . HIV Screening  03/11/1979  . INFLUENZA VACCINE  02/16/2018  . COLONOSCOPY  05/09/2024  . TETANUS/TDAP  07/01/2027  . Hepatitis C Screening  Completed  Discussed health benefits of physical activity, and encouraged him to engage in regular exercise appropriate for his age and condition.  1. Annual physical exam  - CBC with Differential/Platelet - Comprehensive metabolic panel - Lipid Panel With LDL/HDL Ratio - TSH  2. Prostate cancer screening  - PSA  3. Gastroesophageal reflux disease, esophagitis presence not specified Take omeprazole to twice daily. - omeprazole (PRILOSEC) 20 MG capsule; Take 1 capsule (20 mg total) by mouth 2 (two) times daily before a meal.  Dispense: 60 capsule; Refill: 12  4. Restless leg Double ropinirole dose. - rOPINIRole (REQUIP) 2 MG tablet; Take 1 tablet (2 mg total) by mouth at bedtime.  Dispense: 30 tablet; Refill: 12  5. Allergic rhinitis, unspecified seasonality, unspecified trigger Flonase. - fluticasone (FLONASE) 50 MCG/ACT nasal spray; Place 2 sprays into both nostrils daily.  Dispense: 16 g; Refill: 6       HPI, Exam and A&P Transcribed under the direction and in the presence of [name]., MD. Electronically Signed: Janey Greaser, RMA I have done the exam and reviewed the chart and it is accurate to the best of my knowledge. Dentist has been used and  any errors in dictation or transcription are unintentional. Julieanne Manson M.D. [facility]

## 2018-08-17 LAB — PSA: Prostate Specific Ag, Serum: 1.1 ng/mL (ref 0.0–4.0)

## 2018-08-17 LAB — CBC WITH DIFFERENTIAL/PLATELET
BASOS: 2 %
Basophils Absolute: 0.1 10*3/uL (ref 0.0–0.2)
EOS (ABSOLUTE): 0.2 10*3/uL (ref 0.0–0.4)
EOS: 4 %
HEMATOCRIT: 46.3 % (ref 37.5–51.0)
Hemoglobin: 16.2 g/dL (ref 13.0–17.7)
IMMATURE GRANS (ABS): 0 10*3/uL (ref 0.0–0.1)
IMMATURE GRANULOCYTES: 0 %
LYMPHS: 36 %
Lymphocytes Absolute: 1.5 10*3/uL (ref 0.7–3.1)
MCH: 32.3 pg (ref 26.6–33.0)
MCHC: 35 g/dL (ref 31.5–35.7)
MCV: 92 fL (ref 79–97)
MONOS ABS: 0.4 10*3/uL (ref 0.1–0.9)
Monocytes: 9 %
NEUTROS PCT: 49 %
Neutrophils Absolute: 2 10*3/uL (ref 1.4–7.0)
Platelets: 205 10*3/uL (ref 150–450)
RBC: 5.02 x10E6/uL (ref 4.14–5.80)
RDW: 12.3 % (ref 11.6–15.4)
WBC: 4.1 10*3/uL (ref 3.4–10.8)

## 2018-08-17 LAB — COMPREHENSIVE METABOLIC PANEL
A/G RATIO: 2 (ref 1.2–2.2)
ALT: 28 IU/L (ref 0–44)
AST: 27 IU/L (ref 0–40)
Albumin: 4.5 g/dL (ref 3.8–4.9)
Alkaline Phosphatase: 62 IU/L (ref 39–117)
BUN/Creatinine Ratio: 11 (ref 9–20)
BUN: 12 mg/dL (ref 6–24)
Bilirubin Total: 1 mg/dL (ref 0.0–1.2)
CALCIUM: 9.5 mg/dL (ref 8.7–10.2)
CO2: 24 mmol/L (ref 20–29)
Chloride: 102 mmol/L (ref 96–106)
Creatinine, Ser: 1.08 mg/dL (ref 0.76–1.27)
GFR, EST AFRICAN AMERICAN: 89 mL/min/{1.73_m2} (ref >59)
GFR, EST NON AFRICAN AMERICAN: 77 mL/min/{1.73_m2} (ref >59)
GLOBULIN, TOTAL: 2.2 g/dL (ref 1.5–4.5)
Glucose: 95 mg/dL (ref 65–99)
POTASSIUM: 4.4 mmol/L (ref 3.5–5.2)
SODIUM: 141 mmol/L (ref 134–144)
TOTAL PROTEIN: 6.7 g/dL (ref 6.0–8.5)

## 2018-08-17 LAB — LIPID PANEL WITH LDL/HDL RATIO
Cholesterol, Total: 180 mg/dL (ref 100–199)
HDL: 51 mg/dL (ref >39)
LDL Calculated: 112 mg/dL — ABNORMAL HIGH (ref 0–99)
LDL/HDL RATIO: 2.2 ratio (ref 0.0–3.6)
Triglycerides: 84 mg/dL (ref 0–149)
VLDL Cholesterol Cal: 17 mg/dL (ref 5–40)

## 2018-08-17 LAB — TSH: TSH: 2.77 u[IU]/mL (ref 0.450–4.500)

## 2018-12-05 ENCOUNTER — Ambulatory Visit: Payer: Self-pay | Admitting: Family Medicine

## 2018-12-21 ENCOUNTER — Ambulatory Visit: Payer: Self-pay | Admitting: Family Medicine

## 2019-01-23 ENCOUNTER — Ambulatory Visit: Payer: Self-pay | Admitting: Family Medicine

## 2019-02-05 ENCOUNTER — Encounter: Payer: Self-pay | Admitting: Family Medicine

## 2019-02-05 ENCOUNTER — Ambulatory Visit (INDEPENDENT_AMBULATORY_CARE_PROVIDER_SITE_OTHER): Payer: BC Managed Care – PPO | Admitting: Family Medicine

## 2019-02-05 ENCOUNTER — Other Ambulatory Visit: Payer: Self-pay

## 2019-02-05 VITALS — BP 134/77 | HR 77 | Temp 98.6°F | Resp 16 | Ht 69.0 in | Wt 190.0 lb

## 2019-02-05 DIAGNOSIS — R079 Chest pain, unspecified: Secondary | ICD-10-CM

## 2019-02-05 DIAGNOSIS — E785 Hyperlipidemia, unspecified: Secondary | ICD-10-CM | POA: Diagnosis not present

## 2019-02-05 DIAGNOSIS — K219 Gastro-esophageal reflux disease without esophagitis: Secondary | ICD-10-CM

## 2019-02-05 MED ORDER — PANTOPRAZOLE SODIUM 40 MG PO TBEC: 40.0000 mg | DELAYED_RELEASE_TABLET | Freq: Two times a day (BID) | ORAL | 3 refills | Status: DC

## 2019-02-05 NOTE — Progress Notes (Signed)
Patient: Alan Schultz Male    DOB: [birth year]   53 y.o.   MRN: [id number] Visit Date: 02/05/2019  Today's Provider: Wilhemena Durie, MD   Chief Complaint  Patient presents with  . Gastroesophageal Reflux   Subjective:   HPI Patient reports that he has had worsening GERD for the last 3 days. He is currently taking omeprazole 20mg  2 capsules BID, but it has not helped. He describes reflux symptoms up into his chest. Better after belching but he is concerned it could be his heart. It is not exertional and he has no diaphoresis or SOB. He states he has felt more fatigued during this time. No PND or orthopnea. No nausea or weight loss. He says that 3 days ago he had mild chest pain with no associated symptoms--1/10 on pain  Scale.    No Known Allergies   Current Outpatient Medications:  .  omeprazole (PRILOSEC) 20 MG capsule, Take 1 capsule (20 mg total) by mouth 2 (two) times daily before a meal., Disp: 60 capsule, Rfl: 12 .  rOPINIRole (REQUIP) 2 MG tablet, Take 1 tablet (2 mg total) by mouth at bedtime., Disp: 30 tablet, Rfl: 12 .  simvastatin (ZOCOR) 40 MG tablet, Take 1 tablet (40 mg total) by mouth at bedtime., Disp: 90 tablet, Rfl: 3 .  sucralfate (CARAFATE) 1 g tablet, Take 1 tablet (1 g total) by mouth 4 (four) times daily -  with meals and at bedtime., Disp: 120 tablet, Rfl: 1 .  fluticasone (FLONASE) 50 MCG/ACT nasal spray, Place 2 sprays into both nostrils daily. (Patient not taking: Reported on 02/05/2019), Disp: 16 g, Rfl: 6  Review of Systems  Constitutional: Negative for activity change, diaphoresis and fatigue.  Respiratory: Negative for cough and shortness of breath.   Gastrointestinal: Negative for abdominal pain and nausea.  Musculoskeletal: Negative for arthralgias.  Allergic/Immunologic: Negative.   Hematological: Negative.   Psychiatric/Behavioral: Negative.     Social History   Tobacco Use  . Smoking status: Never Smoker  . Smokeless tobacco:  Never Used  Substance Use Topics  . Alcohol use: No    Alcohol/week: 0.0 standard drinks      Objective:   BP 134/77   Pulse 77   Temp 98.6 F (37 C)   Resp 16   Ht 5\' 9"  (1.753 m)   Wt 190 lb (86.2 kg)   BMI 28.06 kg/m  Vitals:   02/05/19 1140  BP: 134/77  Pulse: 77  Resp: 16  Temp: 98.6 F (37 C)  Weight: 190 lb (86.2 kg)  Height: 5\' 9"  (1.753 m)     Physical Exam Constitutional:      Appearance: Normal appearance.  HENT:     Head: Normocephalic and atraumatic.     Right Ear: External ear normal.     Left Ear: External ear normal.  Eyes:     General: No scleral icterus.    Conjunctiva/sclera: Conjunctivae normal.  Cardiovascular:     Rate and Rhythm: Normal rate and regular rhythm.     Pulses: Normal pulses.     Heart sounds: Normal heart sounds.  Pulmonary:     Effort: Pulmonary effort is normal.     Breath sounds: Normal breath sounds.  Abdominal:     Palpations: Abdomen is soft.  Skin:    General: Skin is warm and dry.  Neurological:     General: No focal deficit present.     Mental Status: He is alert  and oriented to person, place, and time.  Psychiatric:        Mood and Affect: Mood normal.        Behavior: Behavior normal.        Thought Content: Thought content normal.        Judgment: Judgment normal.   ECG --NSR with no STTWCs   No results found for any visits on 02/05/19.     Assessment & Plan    1. Gastroesophageal reflux disease, esophagitis presence not specified May need GI referral if persistent.More than 50% 25 minute visit spent in counseling or coordination of care  - EKG 12-Lead - pantoprazole (PROTONIX) 40 MG tablet; Take 1 tablet (40 mg total) by mouth 2 (two) times daily.  Dispense: 60 tablet; Refill: 3  2. Chest pain, unspecified type Pt wishs cardiology evaluation. - Ambulatory referral to Cardiology  3. Hyperlipidemia, unspecified hyperlipidemia type      Megan Mansichard Kati Riggenbach Jr, MD  [facility]  [facility]

## 2019-02-06 ENCOUNTER — Telehealth: Payer: Self-pay | Admitting: Family Medicine

## 2019-02-06 NOTE — Telephone Encounter (Signed)
Alan Schultz with [facility] cardio needs pts last EKG tracing faxed to [phone]  [facility]

## 2019-02-16 DIAGNOSIS — E782 Mixed hyperlipidemia: Secondary | ICD-10-CM | POA: Diagnosis not present

## 2019-02-16 DIAGNOSIS — I10 Essential (primary) hypertension: Secondary | ICD-10-CM | POA: Diagnosis not present

## 2019-02-16 DIAGNOSIS — I208 Other forms of angina pectoris: Secondary | ICD-10-CM | POA: Diagnosis not present

## 2019-03-06 DIAGNOSIS — E782 Mixed hyperlipidemia: Secondary | ICD-10-CM | POA: Diagnosis not present

## 2019-03-06 DIAGNOSIS — I208 Other forms of angina pectoris: Secondary | ICD-10-CM | POA: Diagnosis not present

## 2019-03-06 DIAGNOSIS — R0789 Other chest pain: Secondary | ICD-10-CM | POA: Diagnosis not present

## 2019-03-06 DIAGNOSIS — I10 Essential (primary) hypertension: Secondary | ICD-10-CM | POA: Diagnosis not present

## 2019-03-14 ENCOUNTER — Ambulatory Visit: Payer: Self-pay | Admitting: Family Medicine

## 2019-04-03 ENCOUNTER — Ambulatory Visit: Payer: Self-pay | Admitting: Family Medicine

## 2019-07-07 DIAGNOSIS — Z20828 Contact with and (suspected) exposure to other viral communicable diseases: Secondary | ICD-10-CM | POA: Diagnosis not present

## 2019-08-06 ENCOUNTER — Other Ambulatory Visit: Payer: Self-pay | Admitting: Family Medicine

## 2019-08-06 DIAGNOSIS — K219 Gastro-esophageal reflux disease without esophagitis: Secondary | ICD-10-CM

## 2019-08-06 NOTE — Telephone Encounter (Signed)
Requested Prescriptions  Pending Prescriptions Disp Refills  . pantoprazole (PROTONIX) 40 MG tablet [Pharmacy Med Name: PANTOPRAZOLE SOD DR 40 MG TAB] 60 tablet 0    Sig: Take 1 tablet (40 mg total) by mouth 2 (two) times daily.     Gastroenterology: Proton Pump Inhibitors Passed - 08/06/2019  2:56 PM      Passed - Valid encounter within last 12 months    Recent Outpatient Visits          6 months ago Gastroesophageal reflux disease, esophagitis presence not specified   [facility] Maple Hudson., MD   11 months ago Annual physical exam   [facility] Maple Hudson., MD   1 year ago Epigastric pain   [facility] Erasmo Downer, MD   2 years ago Annual physical exam   [facility] Maple Hudson., MD   3 years ago Annual physical exam   [facility] Maple Hudson., MD

## 2019-08-27 DIAGNOSIS — M65841 Other synovitis and tenosynovitis, right hand: Secondary | ICD-10-CM | POA: Diagnosis not present

## 2019-08-29 DIAGNOSIS — L918 Other hypertrophic disorders of the skin: Secondary | ICD-10-CM | POA: Diagnosis not present

## 2019-08-29 DIAGNOSIS — Z85828 Personal history of other malignant neoplasm of skin: Secondary | ICD-10-CM | POA: Diagnosis not present

## 2019-08-29 DIAGNOSIS — X32XXXA Exposure to sunlight, initial encounter: Secondary | ICD-10-CM | POA: Diagnosis not present

## 2019-08-29 DIAGNOSIS — L309 Dermatitis, unspecified: Secondary | ICD-10-CM | POA: Diagnosis not present

## 2019-08-29 DIAGNOSIS — U071 COVID-19: Secondary | ICD-10-CM | POA: Diagnosis not present

## 2019-08-29 DIAGNOSIS — L570 Actinic keratosis: Secondary | ICD-10-CM | POA: Diagnosis not present

## 2019-09-06 ENCOUNTER — Other Ambulatory Visit: Payer: Self-pay | Admitting: Family Medicine

## 2019-09-06 DIAGNOSIS — G2581 Restless legs syndrome: Secondary | ICD-10-CM

## 2019-10-18 ENCOUNTER — Encounter: Payer: BC Managed Care – PPO | Admitting: Family Medicine

## 2019-11-05 NOTE — Progress Notes (Signed)
Complete physical exam   I,[name],acting as a scribe for Wilhemena Durie, MD.,have documented all relevant documentation on the behalf of Wilhemena Durie, MD,as directed by  Wilhemena Durie, MD while in the presence of Wilhemena Durie, MD.   Patient: Alan Schultz   DOB: Nov 20, 1965   54 y.o. Male  MRN: [id number] Visit Date: 11/06/2019  Today's healthcare provider: Wilhemena Durie, MD  Subjective:    Chief Complaint  Patient presents with  . Annual Exam    Alan Schultz is a 54 y.o. male who presents today for a complete physical exam.  He reports consuming a well balanced  diet. Home exercise routine includes walking n/a hrs per days. He generally feels well. He reports sleeping well. He does have additional problems to discuss today.  He started exercising 1 week ago.  He states he has predictable dyspnea on exertion for his deconditioning.  I agree with him.  He is having no other symptoms. HPI  Last Colonoscopy-2015, negative  Past Medical History:  Diagnosis Date  . Allergy   . GERD (gastroesophageal reflux disease)   . Hyperlipidemia    Past Surgical History:  Procedure Laterality Date  . NO PAST SURGERIES    . VASECTOMY     Social History   Socioeconomic History  . Marital status: Married    Spouse name: Not on file  . Number of children: Not on file  . Years of education: Not on file  . Highest education level: Not on file  Occupational History  . Not on file  Tobacco Use  . Smoking status: Never Smoker  . Smokeless tobacco: Never Used  Substance and Sexual Activity  . Alcohol use: No    Alcohol/week: 0.0 standard drinks  . Drug use: No  . Sexual activity: Not on file  Other Topics Concern  . Not on file  Social History Narrative  . Not on file   Social Determinants of Health   Financial Resource Strain:   . Difficulty of Paying Living Expenses:   Food Insecurity:   . Worried About Charity fundraiser in the Last Year:   .  Arboriculturist in the Last Year:   Transportation Needs:   . Film/video editor (Medical):   [name] Lack of Transportation (Non-Medical):   Physical Activity:   . Days of Exercise per Week:   . Minutes of Exercise per Session:   Stress:   . Feeling of Stress :   Social Connections:   . Frequency of Communication with Friends and Family:   . Frequency of Social Gatherings with Friends and Family:   . Attends Religious Services:   . Active Member of Clubs or Organizations:   . Attends Archivist Meetings:   [name] Marital Status:   Intimate Partner Violence:   . Fear of Current or Ex-Partner:   . Emotionally Abused:   [name] Physically Abused:   . Sexually Abused:    Family Status  Relation Name Status  . Mother  Alive  . Father  Alive  . Sister  Alive   Family History  Problem Relation Age of Onset  . Osteoporosis Mother    No Known Allergies  Patient Care Team: Jerrol Banana., MD as PCP - General (Family Medicine)   Medications: Outpatient Medications Prior to Visit  Medication Sig  . aspirin 81 MG EC tablet Take 81 mg by mouth daily. Swallow whole.  [name]  pantoprazole (PROTONIX) 40 MG tablet Take 1 tablet (40 mg total) by mouth 2 (two) times daily.  [name] rOPINIRole (REQUIP) 2 MG tablet Take 1 tablet (2 mg total) by mouth at bedtime.  . rosuvastatin (CRESTOR) 20 MG tablet Take 20 mg by mouth at bedtime.  . fluticasone (FLONASE) 50 MCG/ACT nasal spray Place 2 sprays into both nostrils daily. (Patient not taking: Reported on 02/05/2019)  . [DISCONTINUED] simvastatin (ZOCOR) 40 MG tablet Take 1 tablet (40 mg total) by mouth at bedtime. (Patient not taking: Reported on 11/06/2019)  . [DISCONTINUED] sucralfate (CARAFATE) 1 g tablet Take 1 tablet (1 g total) by mouth 4 (four) times daily -  with meals and at bedtime. (Patient not taking: Reported on 11/06/2019)   No facility-administered medications prior to visit.    Review of Systems  Constitutional: Negative.   HENT:  Negative.   Eyes: Negative.   Respiratory: Negative.   Cardiovascular: Negative.   Gastrointestinal: Negative.   Endocrine: Negative.   Genitourinary: Negative.   Musculoskeletal: Negative.   Allergic/Immunologic: Negative.   Neurological: Negative.   Hematological: Negative.   Psychiatric/Behavioral: Negative.          Objective:    BP 124/70 (BP Location: Left Arm, Patient Position: Sitting, Cuff Size: Large)   Pulse 70   Temp (!) 96.8 F (36 C) (Other (Comment))   Resp 16   Ht 5\' 9"  (1.753 m)   Wt 192 lb (87.1 kg)   SpO2 98%   BMI 28.35 kg/m  BP Readings from Last 3 Encounters:  11/06/19 124/70  02/05/19 134/77  08/16/18 129/82   Wt Readings from Last 3 Encounters:  11/06/19 192 lb (87.1 kg)  02/05/19 190 lb (86.2 kg)  08/16/18 200 lb (90.7 kg)      Physical Exam Vitals reviewed.  Constitutional:      Appearance: Normal appearance. He is normal weight.  HENT:     Head: Normocephalic and atraumatic.     Right Ear: Tympanic membrane, ear canal and external ear normal.     Left Ear: Tympanic membrane, ear canal and external ear normal.     Nose: Nose normal.     Mouth/Throat:     Mouth: Mucous membranes are moist.     Pharynx: Oropharynx is clear.  Eyes:     Extraocular Movements: Extraocular movements intact.     Conjunctiva/sclera: Conjunctivae normal.     Pupils: Pupils are equal, round, and reactive to light.  Cardiovascular:     Rate and Rhythm: Normal rate and regular rhythm.     Pulses: Normal pulses.     Heart sounds: Normal heart sounds.  Pulmonary:     Effort: Pulmonary effort is normal.     Breath sounds: Normal breath sounds.  Abdominal:     General: Abdomen is flat.     Palpations: Abdomen is soft.  Genitourinary:    Penis: Normal.      Testes: Normal.  Musculoskeletal:        General: Normal range of motion.     Cervical back: Normal range of motion and neck supple.  Skin:    General: Skin is warm and dry.  Neurological:      General: No focal deficit present.     Mental Status: He is alert and oriented to person, place, and time. Mental status is at baseline.  Psychiatric:        Mood and Affect: Mood normal.        Behavior: Behavior normal.  Thought Content: Thought content normal.        Judgment: Judgment normal.       Depression Screen  PHQ 2/9 Scores 11/06/2019 08/16/2018 06/30/2017  PHQ - 2 Score 0 0 0  PHQ- 9 Score 0 - -    Results for orders placed or performed in visit on 11/06/19  POCT urinalysis dipstick  Result Value Ref Range   Color, UA Yellow    Clarity, UA Clear    Glucose, UA Negative Negative   Bilirubin, UA Negative    Ketones, UA Negative    Spec Grav, UA 1.025 1.010 - 1.025   Blood, UA Negative    pH, UA 6.0 5.0 - 8.0   Protein, UA Negative Negative   Urobilinogen, UA 0.2 0.2 or 1.0 E.U./dL   Nitrite, UA Negative    Leukocytes, UA Negative Negative   Appearance Normal    Odor Normal       Assessment & Plan:    Routine Health Maintenance and Physical Exam  Exercise Activities and Dietary recommendations Goals   None     Immunization History  Administered Date(s) Administered  . Influenza,inj,Quad PF,6+ Mos 06/30/2017  . Td 05/20/1999, 06/30/2017  . Tdap 01/05/2006    Health Maintenance  Topic Date Due  . HIV Screening  Never done  . INFLUENZA VACCINE  02/17/2020  . COLONOSCOPY  05/09/2024  . TETANUS/TDAP  07/01/2027  . Hepatitis C Screening  Completed    Discussed health benefits of physical activity, and encouraged him to engage in regular exercise appropriate for his age and condition.  1. Annual physical exam Return 1 year. - TSH; Future - Comprehensive metabolic panel; Future - Lipid panel; Future - CBC with Differential/Platelet; Future - PSA  2. Hyperlipidemia, unspecified hyperlipidemia type Now on Crestor. - TSH; Future - Comprehensive metabolic panel; Future - Lipid panel; Future - CBC with Differential/Platelet; Future -  rosuvastatin (CRESTOR) 20 MG tablet; Take 20 mg by mouth at bedtime.  3. Prostate cancer screening  - TSH; Future - Comprehensive metabolic panel; Future - Lipid panel; Future - CBC with Differential/Platelet; Future - PSA   No follow-ups on file.     I, [name], MD, have reviewed all documentation for this visit. The documentation on 11/06/19 for the exam, diagnosis, procedures, and orders are all accurate and complete.    Viggo Perko Wendelyn Breslow, MD  [facility] [phone] (phone) [phone] (fax)  [facility]

## 2019-11-06 ENCOUNTER — Other Ambulatory Visit: Payer: Self-pay

## 2019-11-06 ENCOUNTER — Ambulatory Visit (INDEPENDENT_AMBULATORY_CARE_PROVIDER_SITE_OTHER): Payer: BC Managed Care – PPO | Admitting: Family Medicine

## 2019-11-06 ENCOUNTER — Encounter: Payer: Self-pay | Admitting: Family Medicine

## 2019-11-06 VITALS — BP 124/70 | HR 70 | Temp 96.8°F | Resp 16 | Ht 69.0 in | Wt 192.0 lb

## 2019-11-06 DIAGNOSIS — Z0000 Encounter for general adult medical examination without abnormal findings: Secondary | ICD-10-CM | POA: Diagnosis not present

## 2019-11-06 DIAGNOSIS — Z125 Encounter for screening for malignant neoplasm of prostate: Secondary | ICD-10-CM | POA: Diagnosis not present

## 2019-11-06 DIAGNOSIS — E785 Hyperlipidemia, unspecified: Secondary | ICD-10-CM

## 2019-11-06 LAB — POCT URINALYSIS DIPSTICK
Appearance: NORMAL
Bilirubin, UA: NEGATIVE
Blood, UA: NEGATIVE
Glucose, UA: NEGATIVE
Ketones, UA: NEGATIVE
Leukocytes, UA: NEGATIVE
Nitrite, UA: NEGATIVE
Odor: NORMAL
Protein, UA: NEGATIVE
Spec Grav, UA: 1.025 (ref 1.010–1.025)
Urobilinogen, UA: 0.2 E.U./dL
pH, UA: 6 (ref 5.0–8.0)

## 2019-11-07 LAB — PSA: Prostate Specific Ag, Serum: 1 ng/mL (ref 0.0–4.0)

## 2019-11-13 ENCOUNTER — Other Ambulatory Visit: Payer: Self-pay | Admitting: Family Medicine

## 2019-11-13 DIAGNOSIS — G2581 Restless legs syndrome: Secondary | ICD-10-CM

## 2019-11-13 NOTE — Telephone Encounter (Signed)
Requested Prescriptions  Pending Prescriptions Disp Refills  . rOPINIRole (REQUIP) 2 MG tablet [Pharmacy Med Name: ROPINIROLE HCL 2 MG TABLET] 90 tablet 3    Sig: Take 1 tablet (2 mg total) by mouth at bedtime.     Neurology:  Parkinsonian Agents Passed - 11/13/2019  4:45 PM      Passed - Last BP in normal range    BP Readings from Last 1 Encounters:  11/06/19 124/70         Passed - Valid encounter within last 12 months    Recent Outpatient Visits          1 week ago Annual physical exam   [facility] Maple Hudson., MD   9 months ago Gastroesophageal reflux disease, esophagitis presence not specified   [facility] Maple Hudson., MD   1 year ago Annual physical exam   [facility] Maple Hudson., MD   2 years ago Epigastric pain   [facility] Erasmo Downer, MD   2 years ago Annual physical exam   [facility] Maple Hudson., MD

## 2019-11-14 ENCOUNTER — Other Ambulatory Visit: Payer: Self-pay | Admitting: Family Medicine

## 2019-11-14 DIAGNOSIS — E785 Hyperlipidemia, unspecified: Secondary | ICD-10-CM

## 2019-11-14 MED ORDER — ROSUVASTATIN CALCIUM 20 MG PO TABS: 20.0000 mg | ORAL_TABLET | Freq: Every day | ORAL | 3 refills | Status: DC

## 2019-11-14 NOTE — Telephone Encounter (Signed)
Requested medication (s) are due for refill today: yes  Requested medication (s) are on the active medication list: yes  Last refill: 10/05/19  Future visit scheduled: yes  Notes to clinic:  historical provider    Requested Prescriptions  Pending Prescriptions Disp Refills   rosuvastatin (CRESTOR) 20 MG tablet       Sig: Take 1 tablet (20 mg total) by mouth at bedtime.      Cardiovascular:  Antilipid - Statins Failed - 11/14/2019 10:16 AM      Failed - Total Cholesterol in normal range and within 360 days    Cholesterol, Total  Date Value Ref Range Status  08/16/2018 180 100 - 199 mg/dL Final          Failed - LDL in normal range and within 360 days    LDL Cholesterol (Calc)  Date Value Ref Range Status  07/08/2017 129 (H) mg/dL (calc) Final    Comment:    Reference range: <100 . Desirable range <100 mg/dL for primary prevention;   <70 mg/dL for patients with CHD or diabetic patients  with > or = 2 CHD risk factors. [name] LDL-C is now calculated using the Martin-Hopkins  calculation, which is a validated novel method providing  better accuracy than the Friedewald equation in the  estimation of LDL-C.  Horald Pollen et al. Lenox Ahr. 4696;295(28): 2061-2068  (http://education.[facility].com/faq/FAQ164)    LDL Calculated  Date Value Ref Range Status  08/16/2018 112 (H) 0 - 99 mg/dL Final          Failed - HDL in normal range and within 360 days    HDL  Date Value Ref Range Status  08/16/2018 51 >39 mg/dL Final          Failed - Triglycerides in normal range and within 360 days    Triglycerides  Date Value Ref Range Status  08/16/2018 84 0 - 149 mg/dL Final          Passed - Patient is not pregnant      Passed - Valid encounter within last 12 months    Recent Outpatient Visits           1 week ago Annual physical exam   [facility] Maple Hudson., MD   9 months ago Gastroesophageal reflux disease, esophagitis presence not specified    [facility] Maple Hudson., MD   1 year ago Annual physical exam   [facility] Maple Hudson., MD   2 years ago Epigastric pain   [facility] Erasmo Downer, MD   2 years ago Annual physical exam   [facility] Maple Hudson., MD

## 2019-11-14 NOTE — Telephone Encounter (Signed)
Medication Refill - Medication: rosuvastatin (CRESTOR) 20 MG tablet   Has the patient contacted their pharmacy? Yes.   (Agent: If no, request that the patient contact the pharmacy for the refill.) (Agent: If yes, when and what did the pharmacy advise?)  Preferred Pharmacy (with phone number or street name): [facility]  Agent: Please be advised that RX refills may take up to 3 business days. We ask that you follow-up with your pharmacy.

## 2019-12-14 ENCOUNTER — Other Ambulatory Visit: Payer: Self-pay | Admitting: Family Medicine

## 2019-12-14 DIAGNOSIS — K219 Gastro-esophageal reflux disease without esophagitis: Secondary | ICD-10-CM

## 2019-12-27 ENCOUNTER — Other Ambulatory Visit: Payer: Self-pay | Admitting: Family Medicine

## 2019-12-27 DIAGNOSIS — J309 Allergic rhinitis, unspecified: Secondary | ICD-10-CM

## 2019-12-27 NOTE — Telephone Encounter (Signed)
Requested Prescriptions  Pending Prescriptions Disp Refills   fluticasone (FLONASE) 50 MCG/ACT nasal spray [Pharmacy Med Name: FLUTICASONE PROPIONATE NS] 16 g 0    Sig: Place 2 sprays into both nostrils daily.     Ear, Nose, and Throat: Nasal Preparations - Corticosteroids Passed - 12/27/2019  1:32 PM      Passed - Valid encounter within last 12 months    Recent Outpatient Visits          1 month ago Annual physical exam   [facility] Maple Hudson., MD   10 months ago Gastroesophageal reflux disease, esophagitis presence not specified   [facility] Maple Hudson., MD   1 year ago Annual physical exam   [facility] Maple Hudson., MD   2 years ago Epigastric pain   [facility] Erasmo Downer, MD   2 years ago Annual physical exam   [facility] Maple Hudson., MD

## 2020-03-06 ENCOUNTER — Other Ambulatory Visit: Payer: Self-pay | Admitting: Family Medicine

## 2020-03-06 DIAGNOSIS — J309 Allergic rhinitis, unspecified: Secondary | ICD-10-CM

## 2020-05-11 DIAGNOSIS — R0981 Nasal congestion: Secondary | ICD-10-CM | POA: Diagnosis not present

## 2020-05-11 DIAGNOSIS — Z20822 Contact with and (suspected) exposure to covid-19: Secondary | ICD-10-CM | POA: Diagnosis not present

## 2020-08-27 DIAGNOSIS — Z85828 Personal history of other malignant neoplasm of skin: Secondary | ICD-10-CM | POA: Diagnosis not present

## 2020-08-27 DIAGNOSIS — D2271 Melanocytic nevi of right lower limb, including hip: Secondary | ICD-10-CM | POA: Diagnosis not present

## 2020-08-27 DIAGNOSIS — D225 Melanocytic nevi of trunk: Secondary | ICD-10-CM | POA: Diagnosis not present

## 2020-08-27 DIAGNOSIS — L570 Actinic keratosis: Secondary | ICD-10-CM | POA: Diagnosis not present

## 2020-08-27 DIAGNOSIS — X32XXXA Exposure to sunlight, initial encounter: Secondary | ICD-10-CM | POA: Diagnosis not present

## 2020-08-27 DIAGNOSIS — L218 Other seborrheic dermatitis: Secondary | ICD-10-CM | POA: Diagnosis not present

## 2020-10-30 ENCOUNTER — Other Ambulatory Visit: Payer: Self-pay | Admitting: Family Medicine

## 2020-10-30 DIAGNOSIS — J309 Allergic rhinitis, unspecified: Secondary | ICD-10-CM

## 2020-11-06 ENCOUNTER — Encounter: Payer: Self-pay | Admitting: Family Medicine

## 2020-11-25 ENCOUNTER — Other Ambulatory Visit: Payer: Self-pay | Admitting: Family Medicine

## 2020-11-25 DIAGNOSIS — G2581 Restless legs syndrome: Secondary | ICD-10-CM

## 2020-11-25 NOTE — Telephone Encounter (Signed)
   Notes to clinic:  Patient has appt scheduled on 02/24/2021 Review for refill until that time Overdue for follow up    Requested Prescriptions  Pending Prescriptions Disp Refills   rOPINIRole (REQUIP) 2 MG tablet [Pharmacy Med Name: ROPINIROLE HCL 2 MG TABLET] 90 tablet 0    Sig: Take 1 tablet (2 mg total) by mouth at bedtime.      Neurology:  Parkinsonian Agents Failed - 11/25/2020 12:56 PM      Failed - Valid encounter within last 12 months    Recent Outpatient Visits           1 year ago Annual physical exam   [facility] Maple Hudson., MD   1 year ago Gastroesophageal reflux disease, esophagitis presence not specified   [facility] Maple Hudson., MD   2 years ago Annual physical exam   [facility] Maple Hudson., MD   3 years ago Epigastric pain   [facility] Erasmo Downer, MD   3 years ago Annual physical exam   [facility] Maple Hudson., MD       Future Appointments             In 3 months Maple Hudson., MD [facility]             Passed - Last BP in normal range    BP Readings from Last 1 Encounters:  11/06/19 124/70

## 2020-12-17 ENCOUNTER — Other Ambulatory Visit: Payer: Self-pay | Admitting: Family Medicine

## 2020-12-17 DIAGNOSIS — K219 Gastro-esophageal reflux disease without esophagitis: Secondary | ICD-10-CM

## 2020-12-17 NOTE — Telephone Encounter (Signed)
Requested medication (s) are due for refill today: Yes  Requested medication (s) are on the active medication list: Yes  Last refill:  12/14/19  Future visit scheduled: Yes  Notes to clinic:  Unable to refill per protocol, Rx expired.      Requested Prescriptions  Pending Prescriptions Disp Refills   pantoprazole (PROTONIX) 40 MG tablet [Pharmacy Med Name: PANTOPRAZOLE SOD DR 40 MG TAB] 180 tablet 0    Sig: Take 1 tablet (40 mg total) by mouth 2 (two) times daily.      Gastroenterology: Proton Pump Inhibitors Failed - 12/17/2020  4:13 PM      Failed - Valid encounter within last 12 months    Recent Outpatient Visits           1 year ago Annual physical exam   [facility] Maple Hudson., MD   1 year ago Gastroesophageal reflux disease, esophagitis presence not specified   [facility] Maple Hudson., MD   2 years ago Annual physical exam   [facility] Maple Hudson., MD   3 years ago Epigastric pain   [facility] Erasmo Downer, MD   3 years ago Annual physical exam   [facility] Maple Hudson., MD       Future Appointments             In 2 months Maple Hudson., MD [facility]

## 2021-01-13 ENCOUNTER — Ambulatory Visit: Payer: BC Managed Care – PPO | Admitting: Family Medicine

## 2021-01-21 ENCOUNTER — Other Ambulatory Visit: Payer: Self-pay | Admitting: Family Medicine

## 2021-01-21 DIAGNOSIS — E785 Hyperlipidemia, unspecified: Secondary | ICD-10-CM

## 2021-01-28 ENCOUNTER — Ambulatory Visit: Payer: BC Managed Care – PPO | Admitting: Family Medicine

## 2021-02-24 ENCOUNTER — Ambulatory Visit (INDEPENDENT_AMBULATORY_CARE_PROVIDER_SITE_OTHER): Payer: BC Managed Care – PPO | Admitting: Family Medicine

## 2021-02-24 ENCOUNTER — Other Ambulatory Visit: Payer: Self-pay

## 2021-02-24 VITALS — BP 128/87 | HR 70 | Temp 97.5°F | Resp 16 | Ht 69.0 in | Wt 197.0 lb

## 2021-02-24 DIAGNOSIS — G609 Hereditary and idiopathic neuropathy, unspecified: Secondary | ICD-10-CM | POA: Diagnosis not present

## 2021-02-24 DIAGNOSIS — Z125 Encounter for screening for malignant neoplasm of prostate: Secondary | ICD-10-CM | POA: Diagnosis not present

## 2021-02-24 DIAGNOSIS — Z0000 Encounter for general adult medical examination without abnormal findings: Secondary | ICD-10-CM | POA: Diagnosis not present

## 2021-02-24 DIAGNOSIS — E785 Hyperlipidemia, unspecified: Secondary | ICD-10-CM

## 2021-02-24 DIAGNOSIS — K648 Other hemorrhoids: Secondary | ICD-10-CM

## 2021-02-24 DIAGNOSIS — G2581 Restless legs syndrome: Secondary | ICD-10-CM

## 2021-02-24 DIAGNOSIS — Z1389 Encounter for screening for other disorder: Secondary | ICD-10-CM

## 2021-02-24 LAB — POCT URINALYSIS DIPSTICK
Bilirubin, UA: NEGATIVE
Blood, UA: NEGATIVE
Glucose, UA: NEGATIVE
Ketones, UA: NEGATIVE
Leukocytes, UA: NEGATIVE
Nitrite, UA: NEGATIVE
Protein, UA: NEGATIVE
Spec Grav, UA: 1.02 (ref 1.010–1.025)
Urobilinogen, UA: 0.2 E.U./dL
pH, UA: 6 (ref 5.0–8.0)

## 2021-02-24 MED ORDER — ROPINIROLE HCL 2 MG PO TABS: 2.0000 mg | ORAL_TABLET | Freq: Every day | ORAL | 1 refills | Status: DC

## 2021-02-24 NOTE — Progress Notes (Signed)
I,Alan Schultz,acting as a scribe for Alan Mans, MD.,have documented all relevant documentation on the behalf of Alan Mans, MD,as directed by  Alan Mans, MD while in the presence of Alan Mans, MD.   Complete physical exam   Patient: Alan Schultz   DOB: 01/16/1966   55 y.o. Male  MRN: [id number] Visit Date: 02/24/2021  Today's healthcare provider: [name], MD   Chief Complaint  Patient presents with   Annual Exam   Subjective    Alan Schultz is a 55 y.o. male who presents today for a complete physical exam.  He reports consuming a general diet. The patient does not participate in regular exercise at present. He generally feels well. He reports sleeping well. He does not have additional problems to discuss today.  HPI  Patient states the ropinirole helps him a lot.-RLS Patient states for 3 to 4 months he is dealt with an itching anal sore.  It is intermittent and not bad.  Past Medical History:  Diagnosis Date   Allergy    GERD (gastroesophageal reflux disease)    Hyperlipidemia    Past Surgical History:  Procedure Laterality Date   NO PAST SURGERIES     VASECTOMY     Social History   Socioeconomic History   Marital status: Married    Spouse name: Not on file   Number of children: Not on file   Years of education: Not on file   Highest education level: Not on file  Occupational History   Not on file  Tobacco Use   Smoking status: Never   Smokeless tobacco: Never  Vaping Use   Vaping Use: Never used  Substance and Sexual Activity   Alcohol use: No    Alcohol/week: 0.0 standard drinks   Drug use: No   Sexual activity: Not on file  Other Topics Concern   Not on file  Social History Narrative   Not on file   Social Determinants of Health   Financial Resource Strain: Not on file  Food Insecurity: Not on file  Transportation Needs: Not on file  Physical Activity: Not on file  Stress: Not on file  Social  Connections: Not on file  Intimate Partner Violence: Not on file   Family Status  Relation Name Status   Mother  Alive   Father  Alive   Sister  Alive   Family History  Problem Relation Age of Onset   Osteoporosis Mother    No Known Allergies  Patient Care Team: Maple Hudson., MD as PCP - General (Family Medicine)   Medications: Outpatient Medications Prior to Visit  Medication Sig   aspirin 81 MG EC tablet Take 81 mg by mouth daily. Swallow whole.   pantoprazole (PROTONIX) 40 MG tablet Take 1 tablet (40 mg total) by mouth 2 (two) times daily.   rosuvastatin (CRESTOR) 20 MG tablet Take 1 tablet (20 mg total) by mouth at bedtime.   [DISCONTINUED] rOPINIRole (REQUIP) 2 MG tablet Take 1 tablet (2 mg total) by mouth at bedtime.   [DISCONTINUED] fluticasone (FLONASE) 50 MCG/ACT nasal spray Place 2 sprays into both nostrils daily. (Patient not taking: Reported on 02/24/2021)   No facility-administered medications prior to visit.    Review of Systems  All other systems reviewed and are negative.     Objective    BP 128/87 (BP Location: Left Arm, Patient Position: Sitting, Cuff Size: Large)   Pulse 70   Temp [name])  97.5 F (36.4 C) (Oral)   Resp 16   Ht 5\' 9"  (1.753 m)   Wt 197 lb (89.4 kg)   SpO2 97%   BMI 29.09 kg/m  BP Readings from Last 3 Encounters:  02/24/21 128/87  11/06/19 124/70  02/05/19 134/77   Wt Readings from Last 3 Encounters:  02/24/21 197 lb (89.4 kg)  11/06/19 192 lb (87.1 kg)  02/05/19 190 lb (86.2 kg)      Physical Exam Vitals reviewed.  Constitutional:      Appearance: Normal appearance. He is normal weight.  HENT:     Head: Normocephalic and atraumatic.     Right Ear: Tympanic membrane, ear canal and external ear normal.     Left Ear: Tympanic membrane, ear canal and external ear normal.     Nose: Nose normal.     Mouth/Throat:     Mouth: Mucous membranes are moist.     Pharynx: Oropharynx is clear.  Eyes:     Extraocular  Movements: Extraocular movements intact.     Conjunctiva/sclera: Conjunctivae normal.     Pupils: Pupils are equal, round, and reactive to light.  Cardiovascular:     Rate and Rhythm: Normal rate and regular rhythm.     Pulses: Normal pulses.     Heart sounds: Normal heart sounds.  Pulmonary:     Effort: Pulmonary effort is normal.     Breath sounds: Normal breath sounds.  Abdominal:     General: Abdomen is flat.     Palpations: Abdomen is soft.  Genitourinary:    Penis: Normal.      Testes: Normal.     Comments: Perianal  exam is unremarkable except for some small internal hemorrhoids on the left Musculoskeletal:        General: Normal range of motion.     Cervical back: Normal range of motion and neck supple.  Skin:    General: Skin is warm and dry.  Neurological:     General: No focal deficit present.     Mental Status: He is alert and oriented to person, place, and time. Mental status is at baseline.  Psychiatric:        Mood and Affect: Mood normal.        Behavior: Behavior normal.        Thought Content: Thought content normal.        Judgment: Judgment normal.      Last depression screening scores PHQ 2/9 Scores 02/24/2021 11/06/2019 08/16/2018  PHQ - 2 Score 0 0 0  PHQ- 9 Score 0 0 -   Last fall risk screening Fall Risk  02/24/2021  Falls in the past year? 0  Number falls in past yr: 0  Injury with Fall? 0  Risk for fall due to : No Fall Risks  Follow up Falls evaluation completed   Last Audit-C alcohol use screening Alcohol Use Disorder Test (AUDIT) 02/24/2021  1. How often do you have a drink containing alcohol? 0  2. How many drinks containing alcohol do you have on a typical day when you are drinking? 0  3. How often do you have six or more drinks on one occasion? 0  AUDIT-C Score 0   A score of 3 or more in women, and 4 or more in men indicates increased risk for alcohol abuse, EXCEPT if all of the points are from question 1   Results for orders placed or  performed in visit on 02/24/21  POCT urinalysis dipstick  Result Value Ref Range   Color, UA Amber    Clarity, UA Clear    Glucose, UA Negative Negative   Bilirubin, UA Negative    Ketones, UA Negative    Spec Grav, UA 1.020 1.010 - 1.025   Blood, UA Negative    pH, UA 6.0 5.0 - 8.0   Protein, UA Negative Negative   Urobilinogen, UA 0.2 0.2 or 1.0 E.U./dL   Nitrite, UA Negative    Leukocytes, UA Negative Negative    Assessment & Plan    Routine Health Maintenance and Physical Exam  Exercise Activities and Dietary recommendations  Goals   None     Immunization History  Administered Date(s) Administered   Influenza,inj,Quad PF,6+ Mos 06/30/2017   Td 05/20/1999, 06/30/2017   Tdap 01/05/2006    Health Maintenance  Topic Date Due   HIV Screening  Never done   Zoster Vaccines- Shingrix (1 of 2) Never done   INFLUENZA VACCINE  02/16/2021   COVID-19 Vaccine (1) 03/12/2022 (Originally 03/10/1971)   COLONOSCOPY (Pts 45-42yrs Insurance coverage will need to be confirmed)  05/09/2024   TETANUS/TDAP  07/01/2027   Hepatitis C Screening  Completed   Pneumococcal Vaccine 14-34 Years old  Aged Out   HPV VACCINES  Aged Out    Discussed health benefits of physical activity, and encouraged him to engage in regular exercise appropriate for his age and condition.  1. Annual physical exam  - Lipid panel - TSH - CBC w/Diff/Platelet - Comprehensive Metabolic Panel (CMET)  2. Hyperlipidemia, unspecified hyperlipidemia type  - Lipid panel - TSH - CBC w/Diff/Platelet - Comprehensive Metabolic Panel (CMET)  3. Prostate cancer screening  - PSA  4. Screening for blood or protein in urine  - POCT urinalysis dipstick--Normal  5. Restless leg Would like to get patient off of ropinirole but he states it helps him.  We will check iron panel. - Lipid panel - TSH - CBC w/Diff/Platelet - Comprehensive Metabolic Panel (CMET) - Iron, TIBC and Ferritin Panel - rOPINIRole (REQUIP) 2  MG tablet; Take 1 tablet (2 mg total) by mouth at bedtime.  Dispense: 90 tablet; Refill: 1 6.  Symptomatic internal hemorrhoid Use sitz bath's.  May need referral but at this time exam is normal  Return in about 1 year (around 02/24/2022).     I, Alan Mans, MD, have reviewed all documentation for this visit. The documentation on 03/02/21 for the exam, diagnosis, procedures, and orders are all accurate and complete.    Miray Mancino Wendelyn Breslow, MD  [facility] [phone] (phone) [phone] (fax)  [facility]

## 2021-02-25 LAB — LIPID PANEL
Chol/HDL Ratio: 3.6 ratio (ref 0.0–5.0)
Cholesterol, Total: 178 mg/dL (ref 100–199)
HDL: 50 mg/dL (ref >39)
LDL Chol Calc (NIH): 108 mg/dL — ABNORMAL HIGH (ref 0–99)
Triglycerides: 108 mg/dL (ref 0–149)
VLDL Cholesterol Cal: 20 mg/dL (ref 5–40)

## 2021-02-25 LAB — COMPREHENSIVE METABOLIC PANEL
ALT: 24 IU/L (ref 0–44)
AST: 23 IU/L (ref 0–40)
Albumin/Globulin Ratio: 2 (ref 1.2–2.2)
Albumin: 4.7 g/dL (ref 3.8–4.9)
Alkaline Phosphatase: 74 IU/L (ref 44–121)
BUN/Creatinine Ratio: 11 (ref 9–20)
BUN: 13 mg/dL (ref 6–24)
Bilirubin Total: 1.5 mg/dL — ABNORMAL HIGH (ref 0.0–1.2)
CO2: 25 mmol/L (ref 20–29)
Calcium: 9.6 mg/dL (ref 8.7–10.2)
Chloride: 102 mmol/L (ref 96–106)
Creatinine, Ser: 1.16 mg/dL (ref 0.76–1.27)
Globulin, Total: 2.3 g/dL (ref 1.5–4.5)
Glucose: 110 mg/dL — ABNORMAL HIGH (ref 65–99)
Potassium: 4.5 mmol/L (ref 3.5–5.2)
Sodium: 140 mmol/L (ref 134–144)
Total Protein: 7 g/dL (ref 6.0–8.5)
eGFR: 74 mL/min/{1.73_m2} (ref >59)

## 2021-02-25 LAB — CBC WITH DIFFERENTIAL/PLATELET
Basophils Absolute: 0.1 10*3/uL (ref 0.0–0.2)
Basos: 2 %
EOS (ABSOLUTE): 0.3 10*3/uL (ref 0.0–0.4)
Eos: 6 %
Hematocrit: 46.7 % (ref 37.5–51.0)
Hemoglobin: 15.8 g/dL (ref 13.0–17.7)
Immature Grans (Abs): 0 10*3/uL (ref 0.0–0.1)
Immature Granulocytes: 0 %
Lymphocytes Absolute: 1.7 10*3/uL (ref 0.7–3.1)
Lymphs: 36 %
MCH: 31.7 pg (ref 26.6–33.0)
MCHC: 33.8 g/dL (ref 31.5–35.7)
MCV: 94 fL (ref 79–97)
Monocytes Absolute: 0.4 10*3/uL (ref 0.1–0.9)
Monocytes: 9 %
Neutrophils Absolute: 2.2 10*3/uL (ref 1.4–7.0)
Neutrophils: 47 %
Platelets: 175 10*3/uL (ref 150–450)
RBC: 4.99 x10E6/uL (ref 4.14–5.80)
RDW: 12.4 % (ref 11.6–15.4)
WBC: 4.6 10*3/uL (ref 3.4–10.8)

## 2021-02-25 LAB — IRON,TIBC AND FERRITIN PANEL
Ferritin: 148 ng/mL (ref 30–400)
Iron Saturation: 53 % (ref 15–55)
Iron: 175 ug/dL — ABNORMAL HIGH (ref 38–169)
Total Iron Binding Capacity: 331 ug/dL (ref 250–450)
UIBC: 156 ug/dL (ref 111–343)

## 2021-02-25 LAB — TSH: TSH: 2.37 u[IU]/mL (ref 0.450–4.500)

## 2021-02-25 LAB — PSA: Prostate Specific Ag, Serum: 1.2 ng/mL (ref 0.0–4.0)

## 2021-06-01 ENCOUNTER — Other Ambulatory Visit: Payer: Self-pay | Admitting: Family Medicine

## 2021-06-01 DIAGNOSIS — E785 Hyperlipidemia, unspecified: Secondary | ICD-10-CM

## 2021-07-15 ENCOUNTER — Other Ambulatory Visit: Payer: Self-pay | Admitting: Family Medicine

## 2021-07-15 DIAGNOSIS — K219 Gastro-esophageal reflux disease without esophagitis: Secondary | ICD-10-CM

## 2021-10-08 DIAGNOSIS — L439 Lichen planus, unspecified: Secondary | ICD-10-CM | POA: Diagnosis not present

## 2021-10-08 DIAGNOSIS — D2261 Melanocytic nevi of right upper limb, including shoulder: Secondary | ICD-10-CM | POA: Diagnosis not present

## 2021-10-08 DIAGNOSIS — Z85828 Personal history of other malignant neoplasm of skin: Secondary | ICD-10-CM | POA: Diagnosis not present

## 2021-10-08 DIAGNOSIS — D2262 Melanocytic nevi of left upper limb, including shoulder: Secondary | ICD-10-CM | POA: Diagnosis not present

## 2021-10-08 DIAGNOSIS — D485 Neoplasm of uncertain behavior of skin: Secondary | ICD-10-CM | POA: Diagnosis not present

## 2021-10-08 DIAGNOSIS — L0101 Non-bullous impetigo: Secondary | ICD-10-CM | POA: Diagnosis not present

## 2021-10-08 DIAGNOSIS — L570 Actinic keratosis: Secondary | ICD-10-CM | POA: Diagnosis not present

## 2021-10-08 DIAGNOSIS — X32XXXA Exposure to sunlight, initial encounter: Secondary | ICD-10-CM | POA: Diagnosis not present

## 2021-11-24 ENCOUNTER — Other Ambulatory Visit: Payer: Self-pay | Admitting: Family Medicine

## 2021-11-24 DIAGNOSIS — G2581 Restless legs syndrome: Secondary | ICD-10-CM

## 2022-02-18 ENCOUNTER — Other Ambulatory Visit: Payer: Self-pay | Admitting: Family Medicine

## 2022-02-18 DIAGNOSIS — G2581 Restless legs syndrome: Secondary | ICD-10-CM

## 2022-02-25 ENCOUNTER — Encounter: Payer: Self-pay | Admitting: Family Medicine

## 2022-02-25 ENCOUNTER — Ambulatory Visit (INDEPENDENT_AMBULATORY_CARE_PROVIDER_SITE_OTHER): Payer: BC Managed Care – PPO | Admitting: Family Medicine

## 2022-02-25 VITALS — BP 110/70 | HR 59 | Resp 14 | Ht 69.0 in | Wt 193.0 lb

## 2022-02-25 DIAGNOSIS — G2581 Restless legs syndrome: Secondary | ICD-10-CM | POA: Diagnosis not present

## 2022-02-25 DIAGNOSIS — Z0000 Encounter for general adult medical examination without abnormal findings: Secondary | ICD-10-CM | POA: Diagnosis not present

## 2022-02-25 DIAGNOSIS — K219 Gastro-esophageal reflux disease without esophagitis: Secondary | ICD-10-CM | POA: Diagnosis not present

## 2022-02-25 DIAGNOSIS — Z125 Encounter for screening for malignant neoplasm of prostate: Secondary | ICD-10-CM | POA: Diagnosis not present

## 2022-02-25 DIAGNOSIS — E785 Hyperlipidemia, unspecified: Secondary | ICD-10-CM | POA: Diagnosis not present

## 2022-02-25 MED ORDER — ROSUVASTATIN CALCIUM 20 MG PO TABS: 20.0000 mg | ORAL_TABLET | Freq: Every day | ORAL | 3 refills | Status: AC

## 2022-02-25 MED ORDER — GABAPENTIN 100 MG PO CAPS: ORAL_CAPSULE | ORAL | 1 refills | Status: DC

## 2022-02-25 MED ORDER — ROPINIROLE HCL 2 MG PO TABS
2.0000 mg | ORAL_TABLET | Freq: Every day | ORAL | 1 refills | Status: AC
Start: 2022-02-25 — End: ?

## 2022-02-25 MED ORDER — PANTOPRAZOLE SODIUM 40 MG PO TBEC: 40.0000 mg | DELAYED_RELEASE_TABLET | Freq: Two times a day (BID) | ORAL | 3 refills | Status: AC

## 2022-02-25 NOTE — Patient Instructions (Signed)
STOP ROPINIROLE!!

## 2022-02-25 NOTE — Progress Notes (Signed)
Complete physical exam  I,[name],acting as a scribe for [name], MD.,have documented all relevant documentation on the behalf of [name], MD,as directed by  [name], MD while in the presence of [name], MD.   Patient: Alan Schultz   DOB: Mar 18, 1966   56 y.o. Male  MRN: [id number] Visit Date: 02/25/2022  Today's healthcare provider: [name], MD   Chief Complaint  Patient presents with   Annual Exam   Subjective    Alan Schultz is a 56 y.o. male who presents today for a complete physical exam.  He reports consuming a general diet. The patient does not participate in regular exercise at present. He generally feels well. He reports sleeping fairly well. He does not have additional problems to discuss today.  HPI  Feels well.  Sleep states he needs ropinirole to control his restless leg syndrome. RLS we had a normal iron study from recent years.  Past Medical History:  Diagnosis Date   Allergy    GERD (gastroesophageal reflux disease)    Hyperlipidemia    Past Surgical History:  Procedure Laterality Date   NO PAST SURGERIES     VASECTOMY     Social History   Socioeconomic History   Marital status: Married    Spouse name: Not on file   Number of children: Not on file   Years of education: Not on file   Highest education level: Not on file  Occupational History   Not on file  Tobacco Use   Smoking status: Never   Smokeless tobacco: Never  Vaping Use   Vaping Use: Never used  Substance and Sexual Activity   Alcohol use: No    Alcohol/week: 0.0 standard drinks of alcohol   Drug use: No   Sexual activity: Not on file  Other Topics Concern   Not on file  Social History Narrative   Not on file   Social Determinants of Health   Financial Resource Strain: Not on file  Food Insecurity: Not on file  Transportation Needs: Not on file  Physical Activity: Not on file  Stress: Not on file  Social  Connections: Not on file  Intimate Partner Violence: Not on file   Family Status  Relation Name Status   Mother  Alive   Father  Alive   Sister  Alive   Family History  Problem Relation Age of Onset   Osteoporosis Mother    No Known Allergies  Patient Care Team: Maple Hudson., MD as PCP - General (Family Medicine)   Medications: Outpatient Medications Prior to Visit  Medication Sig   aspirin 81 MG EC tablet Take 81 mg by mouth daily. Swallow whole.   [DISCONTINUED] pantoprazole (PROTONIX) 40 MG tablet Take 1 tablet (40 mg total) by mouth 2 (two) times daily.   [DISCONTINUED] rOPINIRole (REQUIP) 2 MG tablet Take 1 tablet (2 mg total) by mouth at bedtime.   [DISCONTINUED] rosuvastatin (CRESTOR) 20 MG tablet Take 1 tablet (20 mg total) by mouth at bedtime.   No facility-administered medications prior to visit.    Review of Systems  Constitutional: Negative.   HENT: Negative.    Eyes: Negative.   Respiratory: Negative.    Cardiovascular: Negative.   Gastrointestinal: Negative.   Endocrine: Negative.   Genitourinary: Negative.   Musculoskeletal: Negative.   Skin: Negative.   Allergic/Immunologic: Negative.   Neurological: Negative.   Hematological: Negative.   Psychiatric/Behavioral: Negative.  Last lipids Lab Results  Component Value Date   CHOL 191 02/25/2022   HDL 54 02/25/2022   LDLCALC 123 (H) 02/25/2022   TRIG 77 02/25/2022   CHOLHDL 3.6 02/24/2021      Objective     BP 110/70 (BP Location: Right Arm, Patient Position: Sitting, Cuff Size: Large)   Pulse (!) 59   Resp 14   Ht 5\' 9"  (1.753 m)   Wt 193 lb (87.5 kg)   SpO2 96%   BMI 28.50 kg/m  BP Readings from Last 3 Encounters:  02/25/22 110/70  02/24/21 128/87  11/06/19 124/70   Wt Readings from Last 3 Encounters:  02/25/22 193 lb (87.5 kg)  02/24/21 197 lb (89.4 kg)  11/06/19 192 lb (87.1 kg)       Physical Exam Constitutional:      Appearance: Normal appearance. He is  normal weight.  HENT:     Head: Normocephalic and atraumatic.     Right Ear: Tympanic membrane, ear canal and external ear normal.     Left Ear: Tympanic membrane, ear canal and external ear normal.     Nose: Nose normal.     Mouth/Throat:     Mouth: Mucous membranes are moist.     Pharynx: Oropharynx is clear.  Eyes:     Extraocular Movements: Extraocular movements intact.     Conjunctiva/sclera: Conjunctivae normal.     Pupils: Pupils are equal, round, and reactive to light.  Cardiovascular:     Rate and Rhythm: Normal rate and regular rhythm.     Pulses: Normal pulses.     Heart sounds: Normal heart sounds.  Pulmonary:     Effort: Pulmonary effort is normal.     Breath sounds: Normal breath sounds.  Abdominal:     General: Abdomen is flat. Bowel sounds are normal.     Palpations: Abdomen is soft.  Genitourinary:    Penis: Normal.      Testes: Normal.  Musculoskeletal:     Cervical back: Neck supple.  Skin:    General: Skin is warm and dry.  Neurological:     General: No focal deficit present.     Mental Status: He is alert and oriented to person, place, and time.  Psychiatric:        Mood and Affect: Mood normal.        Behavior: Behavior normal.        Thought Content: Thought content normal.        Judgment: Judgment normal.       Last depression screening scores    02/25/2022   10:01 AM 02/24/2021   10:07 AM 11/06/2019    8:10 AM  PHQ 2/9 Scores  PHQ - 2 Score 0 0 0  PHQ- 9 Score 0 0 0   Last fall risk screening    02/25/2022   10:01 AM  Fall Risk   Falls in the past year? 0  Number falls in past yr: 0  Injury with Fall? 0  Risk for fall due to : No Fall Risks  Follow up Falls evaluation completed   Last Audit-C alcohol use screening    02/25/2022   10:01 AM  Alcohol Use Disorder Test (AUDIT)  1. How often do you have a drink containing alcohol? 0  2. How many drinks containing alcohol do you have on a typical day when you are drinking? 0  3. How  often do you have six or more drinks on one occasion? 0  AUDIT-C Score 0   A score of 3 or more in women, and 4 or more in men indicates increased risk for alcohol abuse, EXCEPT if all of the points are from question 1   No results found for any visits on 02/25/22.  Assessment & Plan    Routine Health Maintenance and Physical Exam  Exercise Activities and Dietary recommendations  Goals   None     Immunization History  Administered Date(s) Administered   Influenza,inj,Quad PF,6+ Mos 06/30/2017   Td 05/20/1999, 06/30/2017   Tdap 01/05/2006    Health Maintenance  Topic Date Due   HIV Screening  Never done   Zoster Vaccines- Shingrix (1 of 2) Never done   INFLUENZA VACCINE  02/16/2022   COVID-19 Vaccine (1) 03/12/2022 (Originally 09/10/1966)   COLONOSCOPY (Pts 45-68yrs Insurance coverage will need to be confirmed)  05/09/2024   TETANUS/TDAP  07/01/2027   Hepatitis C Screening  Completed   HPV VACCINES  Aged Out    Discussed health benefits of physical activity, and encouraged him to engage in regular exercise appropriate for his age and condition.  1. Annual physical exam  - CBC with Differential/Platelet - Comprehensive metabolic panel - Lipid Panel With LDL/HDL Ratio - TSH  2. Prostate cancer screening  - PSA  3. Gastroesophageal reflux disease  - pantoprazole (PROTONIX) 40 MG tablet; Take 1 tablet (40 mg total) by mouth 2 (two) times daily.  Dispense: 180 tablet; Refill: 3  4. Restless leg  - rOPINIRole (REQUIP) 2 MG tablet; Take 1 tablet (2 mg total) by mouth at bedtime.  Dispense: 90 tablet; Refill: 1  5. Hyperlipidemia, unspecified hyperlipidemia type  - rosuvastatin (CRESTOR) 20 MG tablet; Take 1 tablet (20 mg total) by mouth at bedtime.  Dispense: 90 tablet; Refill: 3  6. RLS (restless legs syndrome) Weight from ropinirole and try gabapentin.  Follow-up in 3 weeks.  Left and about this is a ropinirole helps him a lot. - gabapentin (NEURONTIN) 100 MG  capsule; Take One Capsule In The AM and Two Capsules In The PM  Dispense: 90 capsule; Refill: 1  I, [name], MD, have reviewed all documentation for this visit. The documentation on 02/27/22 for the exam, diagnosis, procedures, and orders are all accurate and complete.  Return in about 4 weeks (around 03/25/2022).     I, [name], MD, have reviewed all documentation for this visit. The documentation on 02/27/22 for the exam, diagnosis, procedures, and orders are all accurate and complete.    Tyshika Baldridge Wendelyn Breslow, MD  [facility] [phone] (phone) [phone] (fax)  [facility]

## 2022-02-26 LAB — COMPREHENSIVE METABOLIC PANEL
ALT: 24 IU/L (ref 0–44)
AST: 24 IU/L (ref 0–40)
Albumin/Globulin Ratio: 2.2 (ref 1.2–2.2)
Albumin: 4.6 g/dL (ref 3.8–4.9)
Alkaline Phosphatase: 72 IU/L (ref 44–121)
BUN/Creatinine Ratio: 15 (ref 9–20)
BUN: 15 mg/dL (ref 6–24)
Bilirubin Total: 1.1 mg/dL (ref 0.0–1.2)
CO2: 20 mmol/L (ref 20–29)
Calcium: 9.4 mg/dL (ref 8.7–10.2)
Chloride: 105 mmol/L (ref 96–106)
Creatinine, Ser: 1.02 mg/dL (ref 0.76–1.27)
Globulin, Total: 2.1 g/dL (ref 1.5–4.5)
Glucose: 108 mg/dL — ABNORMAL HIGH (ref 70–99)
Potassium: 4.7 mmol/L (ref 3.5–5.2)
Sodium: 144 mmol/L (ref 134–144)
Total Protein: 6.7 g/dL (ref 6.0–8.5)
eGFR: 86 mL/min/{1.73_m2} (ref >59)

## 2022-02-26 LAB — CBC WITH DIFFERENTIAL/PLATELET
Basophils Absolute: 0.1 10*3/uL (ref 0.0–0.2)
Basos: 1 %
EOS (ABSOLUTE): 0.2 10*3/uL (ref 0.0–0.4)
Eos: 4 %
Hematocrit: 46.6 % (ref 37.5–51.0)
Hemoglobin: 15.9 g/dL (ref 13.0–17.7)
Immature Grans (Abs): 0 10*3/uL (ref 0.0–0.1)
Immature Granulocytes: 0 %
Lymphocytes Absolute: 1.5 10*3/uL (ref 0.7–3.1)
Lymphs: 35 %
MCH: 32.3 pg (ref 26.6–33.0)
MCHC: 34.1 g/dL (ref 31.5–35.7)
MCV: 95 fL (ref 79–97)
Monocytes Absolute: 0.3 10*3/uL (ref 0.1–0.9)
Monocytes: 8 %
Neutrophils Absolute: 2.2 10*3/uL (ref 1.4–7.0)
Neutrophils: 52 %
Platelets: 174 10*3/uL (ref 150–450)
RBC: 4.93 x10E6/uL (ref 4.14–5.80)
RDW: 12.4 % (ref 11.6–15.4)
WBC: 4.3 10*3/uL (ref 3.4–10.8)

## 2022-02-26 LAB — LIPID PANEL WITH LDL/HDL RATIO
Cholesterol, Total: 191 mg/dL (ref 100–199)
HDL: 54 mg/dL (ref >39)
LDL Chol Calc (NIH): 123 mg/dL — ABNORMAL HIGH (ref 0–99)
LDL/HDL Ratio: 2.3 ratio (ref 0.0–3.6)
Triglycerides: 77 mg/dL (ref 0–149)
VLDL Cholesterol Cal: 14 mg/dL (ref 5–40)

## 2022-02-26 LAB — PSA: Prostate Specific Ag, Serum: 1.2 ng/mL (ref 0.0–4.0)

## 2022-02-26 LAB — TSH: TSH: 2.18 u[IU]/mL (ref 0.450–4.500)

## 2022-03-24 ENCOUNTER — Ambulatory Visit: Payer: BC Managed Care – PPO | Admitting: Family Medicine

## 2022-05-18 ENCOUNTER — Other Ambulatory Visit: Payer: Self-pay | Admitting: Family Medicine

## 2022-05-18 DIAGNOSIS — G2581 Restless legs syndrome: Secondary | ICD-10-CM

## 2022-05-18 NOTE — Telephone Encounter (Signed)
Requested medication (s) are due for refill today: yes  Requested medication (s) are on the active medication list: yes  Last refill:  02/25/22  Future visit scheduled: no  Notes to clinic:  Unable to refill per protocol, last refill by provider no longer at practice.     Requested Prescriptions  Pending Prescriptions Disp Refills   gabapentin (NEURONTIN) 100 MG capsule 90 capsule 1    Sig: Take One Capsule In The AM and Two Capsules In The PM     Neurology: Anticonvulsants - gabapentin Passed - 05/18/2022  9:47 AM      Passed - Cr in normal range and within 360 days    Creat  Date Value Ref Range Status  07/08/2017 1.09 0.70 - 1.33 mg/dL Final    Comment:    For patients >67 years of age, the reference limit for Creatinine is approximately 13% higher for people identified as African-American. .    Creatinine, Ser  Date Value Ref Range Status  02/25/2022 1.02 0.76 - 1.27 mg/dL Final         Passed - Completed PHQ-2 or PHQ-9 in the last 360 days      Passed - Valid encounter within last 12 months    Recent Outpatient Visits           2 months ago Annual physical exam   [facility] Jerrol Banana., MD   1 year ago Annual physical exam   [facility] Jerrol Banana., MD   2 years ago Annual physical exam   [facility] Jerrol Banana., MD   3 years ago Gastroesophageal reflux disease, esophagitis presence not specified   [facility] Jerrol Banana., MD   3 years ago Annual physical exam   [facility] Jerrol Banana., MD

## 2022-05-18 NOTE — Telephone Encounter (Signed)
Medication Refill - Medication: Gabapentin 100 mg   Has the patient contacted their pharmacy? Yes.   (Agent: If no, request that the patient contact the pharmacy for the refill. If patient does not wish to contact the pharmacy document the reason why and proceed with request.) (Agent: If yes, when and what did the pharmacy advise?)  Preferred Pharmacy (with phone number or street name): [address] Has the patient been seen for an appointment in the last year OR does the patient have an upcoming appointment? Yes.    Agent: Please be advised that RX refills may take up to 3 business days. We ask that you follow-up with your pharmacy.

## 2022-05-19 MED ORDER — GABAPENTIN 100 MG PO CAPS: ORAL_CAPSULE | ORAL | 4 refills | Status: DC

## 2022-12-16 ENCOUNTER — Other Ambulatory Visit: Payer: Self-pay | Admitting: Family Medicine

## 2022-12-16 DIAGNOSIS — G2581 Restless legs syndrome: Secondary | ICD-10-CM

## 2023-01-24 ENCOUNTER — Other Ambulatory Visit: Payer: Self-pay | Admitting: Family Medicine

## 2023-01-24 DIAGNOSIS — G2581 Restless legs syndrome: Secondary | ICD-10-CM

## 2023-03-01 DIAGNOSIS — E782 Mixed hyperlipidemia: Secondary | ICD-10-CM | POA: Diagnosis not present

## 2023-03-01 DIAGNOSIS — Z1389 Encounter for screening for other disorder: Secondary | ICD-10-CM | POA: Diagnosis not present

## 2023-03-01 DIAGNOSIS — Z0000 Encounter for general adult medical examination without abnormal findings: Secondary | ICD-10-CM | POA: Diagnosis not present

## 2023-03-01 DIAGNOSIS — K219 Gastro-esophageal reflux disease without esophagitis: Secondary | ICD-10-CM | POA: Diagnosis not present

## 2023-03-01 DIAGNOSIS — I10 Essential (primary) hypertension: Secondary | ICD-10-CM | POA: Diagnosis not present

## 2023-03-01 DIAGNOSIS — Z125 Encounter for screening for malignant neoplasm of prostate: Secondary | ICD-10-CM | POA: Diagnosis not present

## 2023-06-13 DIAGNOSIS — D225 Melanocytic nevi of trunk: Secondary | ICD-10-CM | POA: Diagnosis not present

## 2023-06-13 DIAGNOSIS — Z85828 Personal history of other malignant neoplasm of skin: Secondary | ICD-10-CM | POA: Diagnosis not present

## 2023-06-13 DIAGNOSIS — D2261 Melanocytic nevi of right upper limb, including shoulder: Secondary | ICD-10-CM | POA: Diagnosis not present

## 2023-06-13 DIAGNOSIS — D2262 Melanocytic nevi of left upper limb, including shoulder: Secondary | ICD-10-CM | POA: Diagnosis not present

## 2023-06-13 DIAGNOSIS — L570 Actinic keratosis: Secondary | ICD-10-CM | POA: Diagnosis not present

## 2024-05-23 ENCOUNTER — Encounter: Payer: Self-pay | Admitting: Gastroenterology

## 2024-05-23 ENCOUNTER — Ambulatory Visit: Admitting: Anesthesiology

## 2024-05-23 ENCOUNTER — Encounter
Admission: RE | Disposition: A | Discharge status: Home / Self Care | Payer: Self-pay | Source: Home / Self Care | Attending: Gastroenterology

## 2024-05-23 ENCOUNTER — Ambulatory Visit
Admission: RE | Admit: 2024-05-23 | Discharge: 2024-05-23 | Disposition: A | Discharge status: Home / Self Care | Attending: Gastroenterology | Admitting: Gastroenterology

## 2024-05-23 DIAGNOSIS — Z1211 Encounter for screening for malignant neoplasm of colon: Secondary | ICD-10-CM | POA: Diagnosis present

## 2024-05-23 DIAGNOSIS — K219 Gastro-esophageal reflux disease without esophagitis: Secondary | ICD-10-CM | POA: Diagnosis not present

## 2024-05-23 DIAGNOSIS — Z83719 Family history of colon polyps, unspecified: Secondary | ICD-10-CM | POA: Diagnosis not present

## 2024-05-23 HISTORY — PX: COLONOSCOPY: SHX5424

## 2024-05-23 SURGERY — COLONOSCOPY
Anesthesia: General

## 2024-05-23 MED ORDER — SODIUM CHLORIDE 0.9 % IV SOLN: INTRAVENOUS | Status: DC

## 2024-05-23 MED ORDER — LIDOCAINE HCL (CARDIAC) PF 100 MG/5ML IV SOSY
PREFILLED_SYRINGE | INTRAVENOUS | Status: DC | PRN
  Administered 2024-05-23: 80 mg via INTRAVENOUS

## 2024-05-23 MED ORDER — PROPOFOL 500 MG/50ML IV EMUL
INTRAVENOUS | Status: DC | PRN
  Administered 2024-05-23: 75 ug/kg/min via INTRAVENOUS

## 2024-05-23 MED ORDER — PROPOFOL 10 MG/ML IV BOLUS
INTRAVENOUS | Status: DC | PRN
  Administered 2024-05-23 (×2): 50 mg via INTRAVENOUS

## 2024-05-23 MED ORDER — DEXMEDETOMIDINE HCL IN NACL 80 MCG/20ML IV SOLN
INTRAVENOUS | Status: DC | PRN
Start: 2024-05-23 — End: 2024-05-23
  Administered 2024-05-23: 12 ug via INTRAVENOUS
  Administered 2024-05-23: 8 ug via INTRAVENOUS

## 2024-05-23 NOTE — H&P (Signed)
 Ruel Kung , MD [address], [address], [place], [place], [address] Phone: [phone] Fax: [phone]  Primary Care Physician:  Bertrum Charlie CROME, MD   Pre-Procedure History & Physical: HPI:  REAL CONA is a 58 y.o. male is here for an colonoscopy.   Past Medical History:  Diagnosis Date   Allergy    GERD (gastroesophageal reflux disease)    Hyperlipidemia     Past Surgical History:  Procedure Laterality Date   NO PAST SURGERIES     VASECTOMY      Prior to Admission medications   Medication Sig Start Date End Date Taking? Authorizing Provider  aspirin 81 MG EC tablet Take 81 mg by mouth daily. Swallow whole.    [provider]  gabapentin  (NEURONTIN ) 100 MG capsule Take One Capsule In The AM and Two Capsules In The PM 01/24/23   Fisher, Nancyann BRAVO, MD  pantoprazole  (PROTONIX ) 40 MG tablet Take 1 tablet (40 mg total) by mouth 2 (two) times daily. 02/25/22   Bertrum Charlie CROME, MD  rOPINIRole  (REQUIP ) 2 MG tablet Take 1 tablet (2 mg total) by mouth at bedtime. 02/25/22   Bertrum Charlie CROME, MD  rosuvastatin  (CRESTOR ) 20 MG tablet Take 1 tablet (20 mg total) by mouth at bedtime. 02/25/22   Bertrum Charlie CROME, MD    Allergies as of 04/16/2024   (No Known Allergies)    Family History  Problem Relation Age of Onset   Osteoporosis Mother     Social History   Socioeconomic History   Marital status: Married    Spouse name: Not on file   Number of children: Not on file   Years of education: Not on file   Highest education level: Not on file  Occupational History   Not on file  Tobacco Use   Smoking status: Never   Smokeless tobacco: Never  Vaping Use   Vaping status: Never Used  Substance and Sexual Activity   Alcohol use: No    Alcohol/week: 0.0 standard drinks of alcohol   Drug use: No   Sexual activity: Not on file  Other  Topics Concern   Not on file  Social History Narrative   Not on file   Social Drivers of Health   Financial Resource Strain: Low Risk  (03/06/2024)   Received from [facility]   Overall Financial Resource Strain (CARDIA)    Difficulty of Paying Living Expenses: Not hard at all  Food Insecurity: No Food Insecurity (03/06/2024)   Received from [facility]   Hunger Vital Sign    Within the past 12 months, you worried that your food would run out before you got the money to buy more.: Never true    Within the past 12 months, the food you bought just didn't last and you didn't have money to get more.: Never true  Transportation Needs: No Transportation Needs (03/06/2024)   Received from [facility] - Transportation    In the past 12 months, has lack of transportation kept you from medical appointments or from getting medications?: No    Lack of Transportation (Non-Medical): No  Physical Activity: Inactive (03/06/2024)   Received from [facility]   Exercise Vital Sign    On average, how many days per week do you engage in moderate to strenuous exercise (like a brisk walk)?: 0 days    On average, how many minutes do you engage in exercise  at this level?: 0 min  Stress: Not on file  Social Connections: Not on file  Intimate Partner Violence: Not on file    Review of Systems: See HPI, otherwise negative ROS  Physical Exam: There were no vitals taken for this visit. General:   Alert,  pleasant and cooperative in NAD Head:  Normocephalic and atraumatic. Neck:  Supple; no masses or thyromegaly. Lungs:  Clear throughout to auscultation, normal respiratory effort.    Heart:  +S1, +S2, Regular rate and rhythm, No edema. Abdomen:  Soft, nontender and nondistended. Normal bowel sounds, without guarding, and without rebound.   Neurologic:  Alert and  oriented x4;  grossly normal  neurologically.  Impression/Plan: HYUN MARSALIS is here for an colonoscopy to be performed for Screening colonoscopy , family history of colon polyps.  Risks, benefits, limitations, and alternatives regarding  colonoscopy have been reviewed with the patient.  Questions have been answered.  All parties agreeable.   Ruel Kung, MD  05/23/2024, 8:47 AM

## 2024-05-23 NOTE — Transfer of Care (Signed)
 Immediate Anesthesia Transfer of Care Note  Patient: Alan Schultz  Procedure(s) Performed: COLONOSCOPY  Patient Location: PACU  Anesthesia Type:General  Level of Consciousness: sedated  Airway & Oxygen Therapy: Patient Spontanous Breathing  Post-op Assessment: Report given to RN and Post -op Vital signs reviewed and stable  Post vital signs: Reviewed and stable  Last Vitals:  Vitals Value Taken Time  BP 91/55 05/23/24 09:54  Temp    Pulse 62 05/23/24 09:54  Resp 15 05/23/24 09:54  SpO2 97 % 05/23/24 09:54  Vitals shown include unfiled device data.  Last Pain:  Vitals:   05/23/24 0917  TempSrc: Temporal  PainSc: 0-No pain         Complications: No notable events documented.

## 2024-05-23 NOTE — Op Note (Signed)
 [facility] Patient Name: Alan Schultz Procedure Date: 05/23/2024 9:30 AM MRN: [id number] Account #: [id number] Date of Birth: 11-08-1965 Admit Type: Outpatient Age: 58 Room: [facility] ENDO ROOM 3 Gender: Male Note Status: Finalized Instrument Name: Colon Scope [id number] Procedure:             Colonoscopy Indications:           Colon cancer screening in patient at increased risk:                         Family history of 1st-degree relative with colon polyps Providers:             Ruel Kung MD, MD Referring MD:          Alan CROME. Bertrum, MD (Referring MD) Medicines:             Monitored Anesthesia Care Complications:         No immediate complications. Procedure:             Pre-Anesthesia Assessment:                        - Prior to the procedure, a History and Physical was                         performed, and patient medications, allergies and                         sensitivities were reviewed. The patient's tolerance                         of previous anesthesia was reviewed.                        - The risks and benefits of the procedure and the                         sedation options and risks were discussed with the                         patient. All questions were answered and informed                         consent was obtained.                        - ASA Grade Assessment: II - A patient with mild                         systemic disease.                        After obtaining informed consent, the colonoscope was                         passed under direct vision. Throughout the procedure,                         the patient's blood pressure, pulse, and oxygen  saturations were monitored continuously. The                         Colonoscope was introduced through the anus and                         advanced to the the cecum, identified by the                         appendiceal orifice. The colonoscopy was  performed                         with ease. The patient tolerated the procedure well.                         The quality of the bowel preparation was excellent.                         The ileocecal valve, appendiceal orifice, and rectum                         were photographed. Findings:      The entire examined colon appeared normal on direct and retroflexion       views. Impression:            - The entire examined colon is normal on direct and                         retroflexion views.                        - No specimens collected. Recommendation:        - Discharge patient to home (with escort).                        - Resume previous diet.                        - Continue present medications.                        - Repeat colonoscopy in 10 years for screening                         purposes. Procedure Code(s):     --- Professional ---                        [phone], Colonoscopy, flexible; diagnostic, including                         collection of specimen(s) by brushing or washing, when                         performed (separate procedure) Diagnosis Code(s):     --- Professional ---                        Z83.71, Family history of colonic polyps CPT copyright 2022 [employer]. All rights reserved. The codes documented in this report are preliminary and upon coder review may  be revised to meet current compliance requirements. Ruel Kung, MD Ruel Kung MD, MD 05/23/2024 9:52:15 AM This report has been signed electronically. Number of Addenda: 0 Note Initiated On: 05/23/2024 9:30 AM Scope Withdrawal Time: 0 hours 7 minutes 53 seconds  Total Procedure Duration: 0 hours 10 minutes 0 seconds  Estimated Blood Loss:  Estimated blood loss: none.      [facility]

## 2024-05-23 NOTE — Anesthesia Postprocedure Evaluation (Signed)
 Anesthesia Post Note  Patient: Alan Schultz  Procedure(s) Performed: COLONOSCOPY  Patient location during evaluation: PACU Anesthesia Type: General Level of consciousness: awake Pain management: satisfactory to patient Vital Signs Assessment: post-procedure vital signs reviewed and stable Respiratory status: nonlabored ventilation Cardiovascular status: stable Anesthetic complications: no   No notable events documented.   Last Vitals:  Vitals:   05/23/24 1018 05/23/24 1027  BP: 99/68 100/76  Pulse: (!) 58   Resp: 19 18  Temp:    SpO2: 95% 99%    Last Pain:  Vitals:   05/23/24 1027  TempSrc:   PainSc: 0-No pain                 VAN STAVEREN,Ioan Landini

## 2024-05-23 NOTE — Anesthesia Preprocedure Evaluation (Signed)
 Anesthesia Evaluation  Patient identified by MRN, date of birth, ID band Patient awake    Reviewed: Allergy & Precautions, NPO status , Patient's Chart, lab work & pertinent test results  Airway Mallampati: II  TM Distance: >3 FB Neck ROM: full    Dental  (+) Teeth Intact   Pulmonary neg pulmonary ROS   Pulmonary exam normal        Cardiovascular Exercise Tolerance: Good negative cardio ROS Normal cardiovascular exam Rhythm:Regular Rate:Normal     Neuro/Psych negative neurological ROS  negative psych ROS   GI/Hepatic negative GI ROS, Neg liver ROS,GERD  Medicated,,  Endo/Other  negative endocrine ROS    Renal/GU negative Renal ROS  negative genitourinary   Musculoskeletal negative musculoskeletal ROS (+)    Abdominal Normal abdominal exam  (+)   Peds negative pediatric ROS (+)  Hematology negative hematology ROS (+)   Anesthesia Other Findings Past Medical History: No date: Allergy No date: GERD (gastroesophageal reflux disease) No date: Hyperlipidemia  Past Surgical History: No date: NO PAST SURGERIES No date: VASECTOMY  BMI    Body Mass Index: 27.50 kg/m      Reproductive/Obstetrics negative OB ROS                              Anesthesia Physical Anesthesia Plan  ASA: 2  Anesthesia Plan: General   Post-op Pain Management:    Induction: Intravenous  PONV Risk Score and Plan: Propofol infusion and TIVA  Airway Management Planned: Natural Airway and Nasal Cannula  Additional Equipment:   Intra-op Plan:   Post-operative Plan:   Informed Consent: I have reviewed the patients History and Physical, chart, labs and discussed the procedure including the risks, benefits and alternatives for the proposed anesthesia with the patient or authorized representative who has indicated his/her understanding and acceptance.     Dental Advisory Given  Plan Discussed with:  CRNA  Anesthesia Plan Comments:         Anesthesia Quick Evaluation

## 2024-05-24 NOTE — Addendum Note (Signed)
 Addendum  created 05/24/24 0958 by Stacie Channel, CRNA   Intraprocedure Staff edited
